# Patient Record
Sex: Female | Born: 1970 | Race: White | Hispanic: No | Smoking: Current every day smoker
Health system: Southern US, Community
[De-identification: ages and names within clinical notes are randomized; demographics above are authoritative.]

## PROBLEM LIST (undated history)

## (undated) DIAGNOSIS — F17200 Nicotine dependence, unspecified, uncomplicated: Secondary | ICD-10-CM

## (undated) DIAGNOSIS — J45909 Unspecified asthma, uncomplicated: Secondary | ICD-10-CM

## (undated) DIAGNOSIS — J9601 Acute respiratory failure with hypoxia: Secondary | ICD-10-CM

## (undated) DIAGNOSIS — J45901 Unspecified asthma with (acute) exacerbation: Secondary | ICD-10-CM

## (undated) HISTORY — PX: TUBAL LIGATION: SHX77

---

## 1998-05-06 ENCOUNTER — Emergency Department (HOSPITAL_COMMUNITY): Admission: EM | Admit: 1998-05-06 | Discharge: 1998-05-06 | Payer: Self-pay | Admitting: Internal Medicine

## 1998-10-29 ENCOUNTER — Emergency Department (HOSPITAL_COMMUNITY): Admission: EM | Admit: 1998-10-29 | Discharge: 1998-10-30 | Payer: Self-pay | Admitting: Emergency Medicine

## 2000-09-02 ENCOUNTER — Emergency Department (HOSPITAL_COMMUNITY): Admission: EM | Admit: 2000-09-02 | Discharge: 2000-09-02 | Payer: Self-pay | Admitting: Emergency Medicine

## 2001-03-11 ENCOUNTER — Emergency Department (HOSPITAL_COMMUNITY): Admission: EM | Admit: 2001-03-11 | Discharge: 2001-03-11 | Payer: Self-pay | Admitting: Internal Medicine

## 2001-07-15 ENCOUNTER — Encounter: Payer: Self-pay | Admitting: Internal Medicine

## 2001-07-15 ENCOUNTER — Ambulatory Visit (HOSPITAL_COMMUNITY): Admission: RE | Admit: 2001-07-15 | Discharge: 2001-07-15 | Payer: Self-pay | Admitting: Internal Medicine

## 2001-09-18 ENCOUNTER — Emergency Department (HOSPITAL_COMMUNITY): Admission: EM | Admit: 2001-09-18 | Discharge: 2001-09-18 | Payer: Self-pay | Admitting: Emergency Medicine

## 2002-06-18 ENCOUNTER — Emergency Department (HOSPITAL_COMMUNITY): Admission: EM | Admit: 2002-06-18 | Discharge: 2002-06-18 | Payer: Self-pay | Admitting: Nurse Practitioner

## 2003-03-28 ENCOUNTER — Encounter: Payer: Self-pay | Admitting: Emergency Medicine

## 2003-03-28 ENCOUNTER — Emergency Department (HOSPITAL_COMMUNITY): Admission: EM | Admit: 2003-03-28 | Discharge: 2003-03-28 | Payer: Self-pay | Admitting: *Deleted

## 2003-11-20 ENCOUNTER — Emergency Department (HOSPITAL_COMMUNITY): Admission: EM | Admit: 2003-11-20 | Discharge: 2003-11-20 | Payer: Self-pay | Admitting: Emergency Medicine

## 2004-03-08 ENCOUNTER — Emergency Department (HOSPITAL_COMMUNITY): Admission: EM | Admit: 2004-03-08 | Discharge: 2004-03-08 | Payer: Self-pay | Admitting: Emergency Medicine

## 2004-04-01 ENCOUNTER — Emergency Department (HOSPITAL_COMMUNITY): Admission: EM | Admit: 2004-04-01 | Discharge: 2004-04-01 | Payer: Self-pay | Admitting: Emergency Medicine

## 2005-02-10 ENCOUNTER — Emergency Department (HOSPITAL_COMMUNITY): Admission: EM | Admit: 2005-02-10 | Discharge: 2005-02-10 | Payer: Self-pay | Admitting: Emergency Medicine

## 2005-04-28 ENCOUNTER — Emergency Department (HOSPITAL_COMMUNITY): Admission: EM | Admit: 2005-04-28 | Discharge: 2005-04-28 | Payer: Self-pay | Admitting: Emergency Medicine

## 2005-07-25 ENCOUNTER — Emergency Department (HOSPITAL_COMMUNITY): Admission: EM | Admit: 2005-07-25 | Discharge: 2005-07-25 | Payer: Self-pay | Admitting: Emergency Medicine

## 2005-10-23 ENCOUNTER — Emergency Department (HOSPITAL_COMMUNITY): Admission: EM | Admit: 2005-10-23 | Discharge: 2005-10-23 | Payer: Self-pay | Admitting: Emergency Medicine

## 2006-06-10 ENCOUNTER — Emergency Department (HOSPITAL_COMMUNITY): Admission: EM | Admit: 2006-06-10 | Discharge: 2006-06-11 | Payer: Self-pay | Admitting: Emergency Medicine

## 2006-06-11 ENCOUNTER — Emergency Department (HOSPITAL_COMMUNITY): Admission: EM | Admit: 2006-06-11 | Discharge: 2006-06-11 | Payer: Self-pay | Admitting: Emergency Medicine

## 2006-07-26 ENCOUNTER — Emergency Department (HOSPITAL_COMMUNITY): Admission: EM | Admit: 2006-07-26 | Discharge: 2006-07-26 | Payer: Self-pay | Admitting: Emergency Medicine

## 2007-01-19 ENCOUNTER — Emergency Department (HOSPITAL_COMMUNITY): Admission: EM | Admit: 2007-01-19 | Discharge: 2007-01-19 | Payer: Self-pay | Admitting: Emergency Medicine

## 2007-06-24 ENCOUNTER — Emergency Department (HOSPITAL_COMMUNITY): Admission: EM | Admit: 2007-06-24 | Discharge: 2007-06-24 | Payer: Self-pay | Admitting: Emergency Medicine

## 2007-10-08 ENCOUNTER — Emergency Department (HOSPITAL_COMMUNITY): Admission: EM | Admit: 2007-10-08 | Discharge: 2007-10-09 | Payer: Self-pay | Admitting: Emergency Medicine

## 2007-10-11 ENCOUNTER — Emergency Department (HOSPITAL_COMMUNITY): Admission: EM | Admit: 2007-10-11 | Discharge: 2007-10-11 | Payer: Self-pay | Admitting: Nurse Practitioner

## 2007-10-17 ENCOUNTER — Emergency Department (HOSPITAL_COMMUNITY): Admission: EM | Admit: 2007-10-17 | Discharge: 2007-10-17 | Payer: Self-pay | Admitting: Emergency Medicine

## 2008-02-04 ENCOUNTER — Emergency Department (HOSPITAL_COMMUNITY): Admission: EM | Admit: 2008-02-04 | Discharge: 2008-02-04 | Payer: Self-pay | Admitting: Emergency Medicine

## 2008-02-11 ENCOUNTER — Emergency Department (HOSPITAL_COMMUNITY): Admission: EM | Admit: 2008-02-11 | Discharge: 2008-02-11 | Payer: Self-pay | Admitting: Emergency Medicine

## 2008-03-27 IMAGING — CR DG CHEST 2V
2 series · 2 of 2 positions shown · non-contrast
Comparison: 02/10/05.

CLINICAL DATA: Asthma, shortness of breath, cough, smoker.
 CHEST - 2 VIEWS:

[w chest pa]
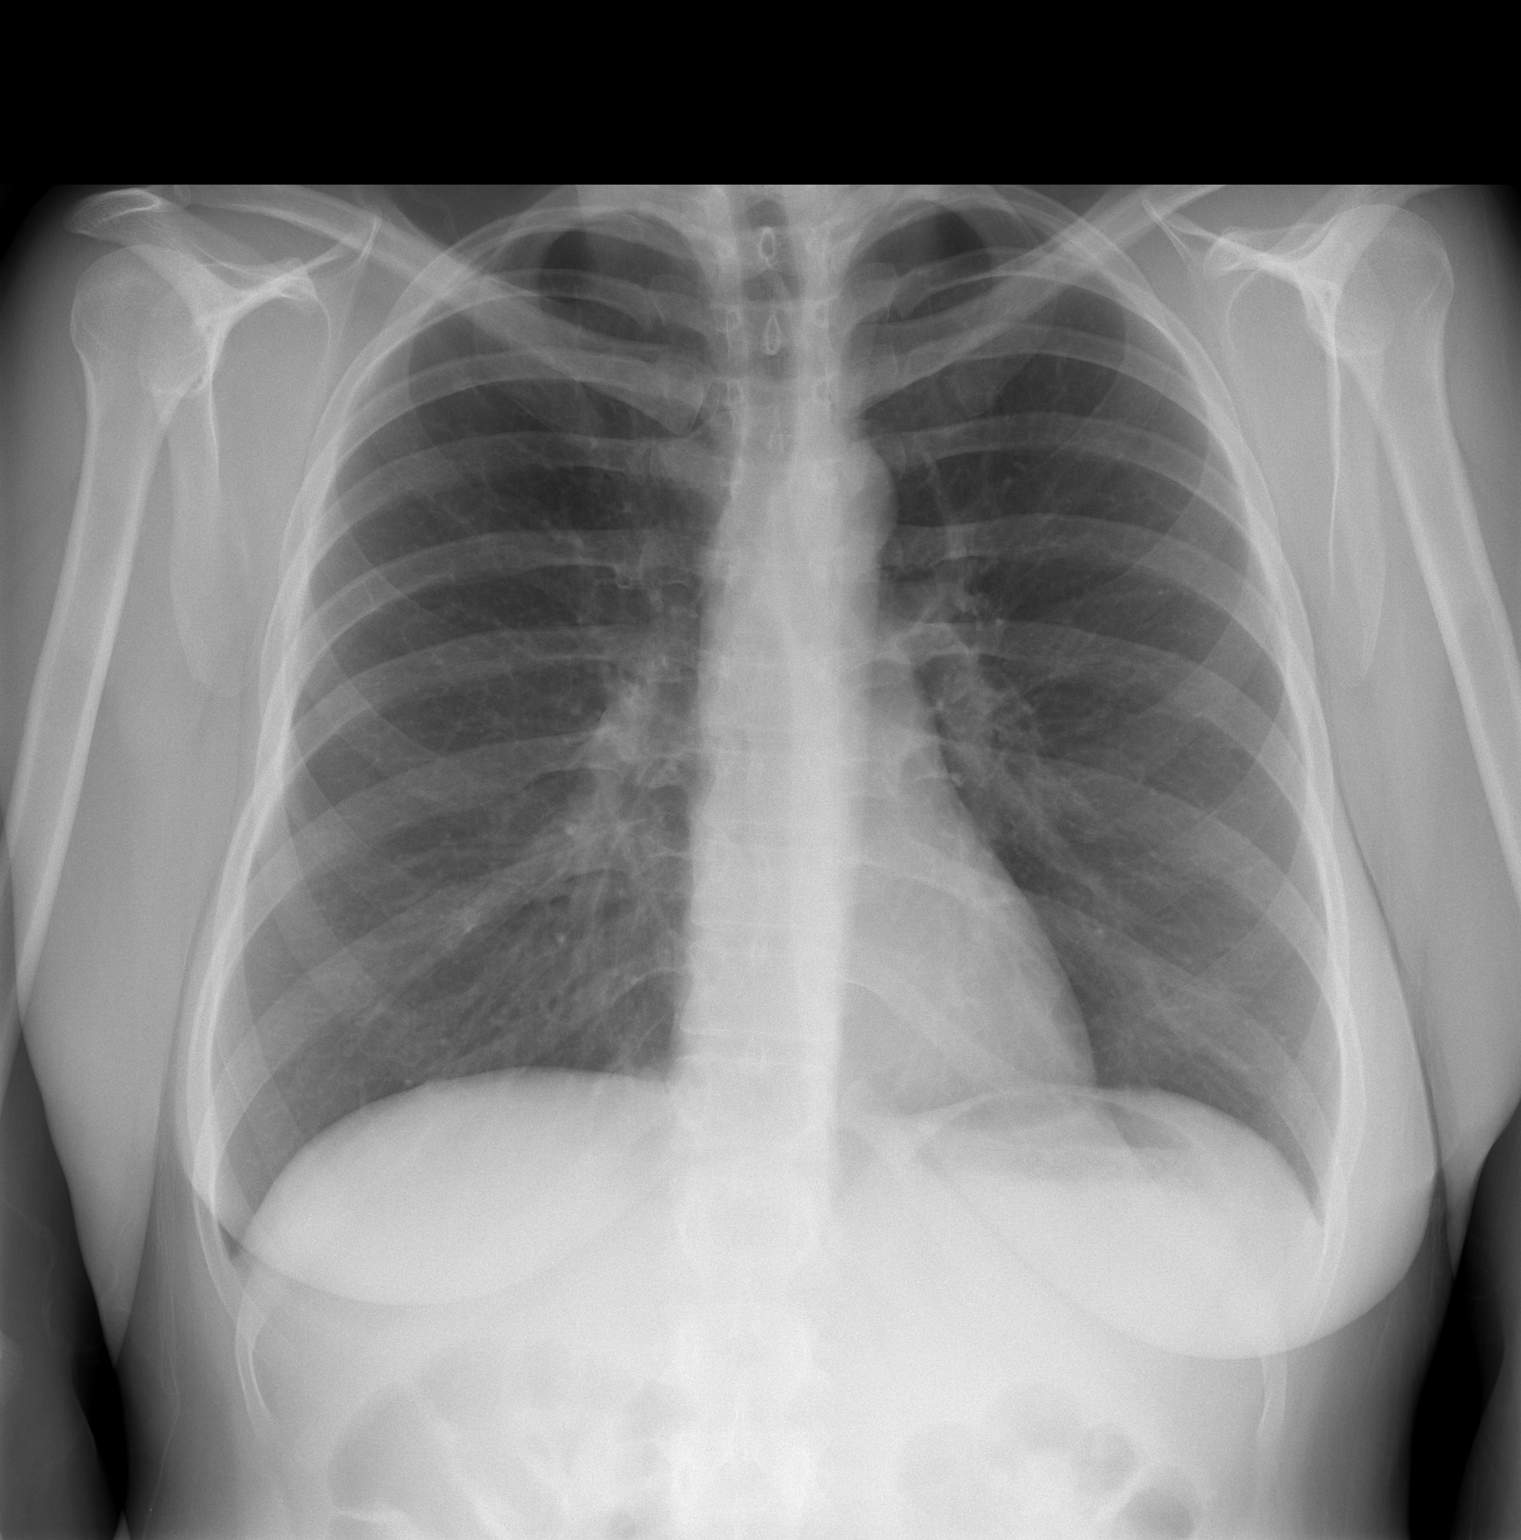

[w chest lat]
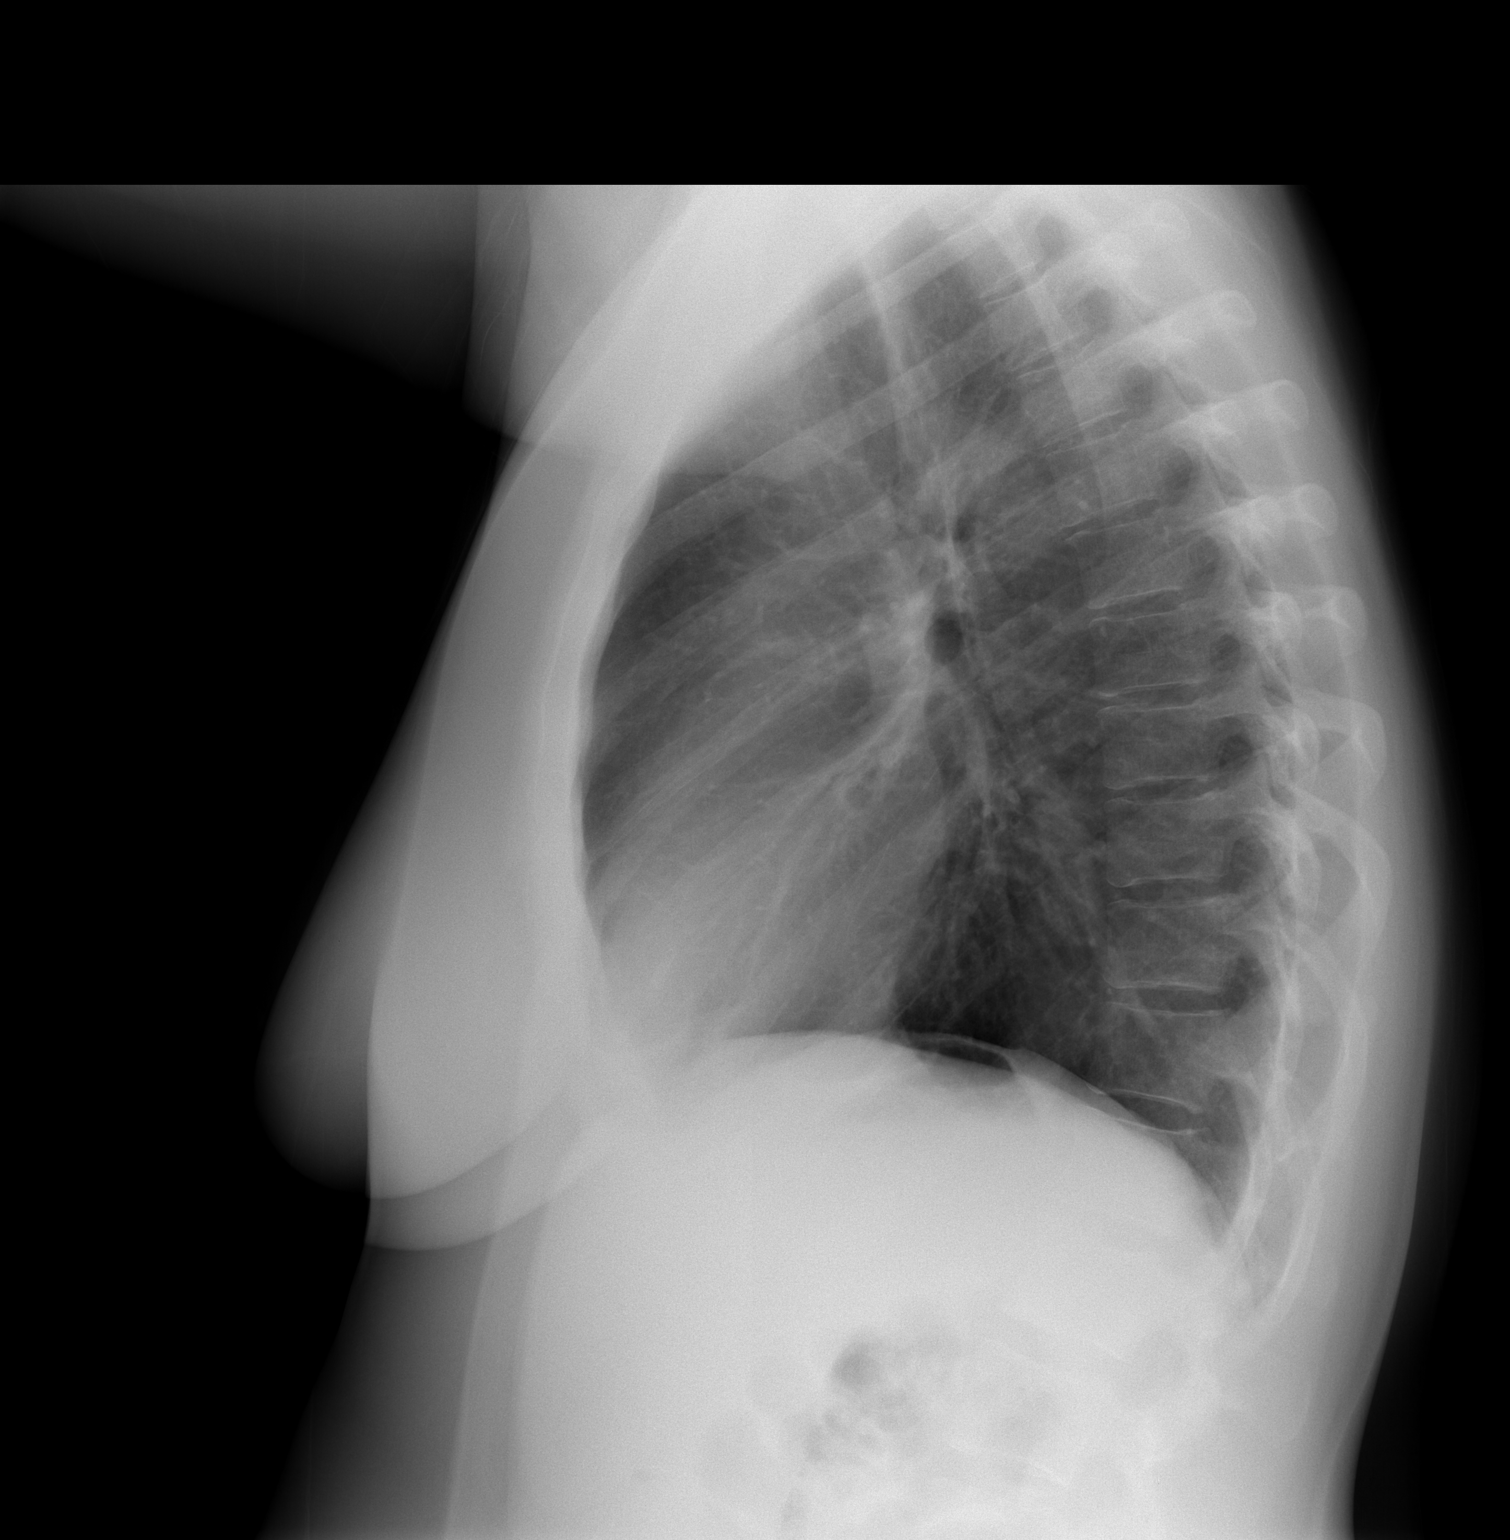

[2 of 2 positions shown; findings below may reference images not displayed]

FINDINGS: There is moderate hyperaeration of the lungs.  The lungs are clear of an active process.  Normal cardiomediastinal silhouette size and contours.
IMPRESSION: Pulmonary hyperaeration is noted and is compatible with asthma.

## 2010-08-07 ENCOUNTER — Emergency Department: Payer: Self-pay | Admitting: Unknown Physician Specialty

## 2010-08-07 ENCOUNTER — Emergency Department: Payer: Self-pay | Admitting: Emergency Medicine

## 2011-07-18 LAB — URINALYSIS, ROUTINE W REFLEX MICROSCOPIC
Glucose, UA: NEGATIVE
Ketones, ur: NEGATIVE
Nitrite: NEGATIVE
Protein, ur: NEGATIVE
Urobilinogen, UA: 0.2

## 2011-07-18 LAB — URINE MICROSCOPIC-ADD ON

## 2011-07-18 LAB — URINE CULTURE

## 2011-07-28 LAB — URINALYSIS, ROUTINE W REFLEX MICROSCOPIC
Nitrite: NEGATIVE
Specific Gravity, Urine: 1.021
Urobilinogen, UA: 1
pH: 6

## 2011-07-28 LAB — INFLUENZA A+B VIRUS AG-DIRECT(RAPID)

## 2011-07-28 LAB — POCT PREGNANCY, URINE: Operator id: 288831

## 2012-06-11 ENCOUNTER — Emergency Department (HOSPITAL_BASED_OUTPATIENT_CLINIC_OR_DEPARTMENT_OTHER)
Admission: EM | Admit: 2012-06-11 | Discharge: 2012-06-12 | Disposition: A | Discharge status: Home / Self Care | Payer: Self-pay | Attending: Emergency Medicine | Admitting: Emergency Medicine

## 2012-06-11 ENCOUNTER — Encounter (HOSPITAL_BASED_OUTPATIENT_CLINIC_OR_DEPARTMENT_OTHER): Payer: Self-pay | Admitting: Emergency Medicine

## 2012-06-11 DIAGNOSIS — J45909 Unspecified asthma, uncomplicated: Secondary | ICD-10-CM | POA: Insufficient documentation

## 2012-06-11 DIAGNOSIS — R404 Transient alteration of awareness: Secondary | ICD-10-CM | POA: Insufficient documentation

## 2012-06-11 DIAGNOSIS — F17200 Nicotine dependence, unspecified, uncomplicated: Secondary | ICD-10-CM | POA: Insufficient documentation

## 2012-06-11 DIAGNOSIS — F1410 Cocaine abuse, uncomplicated: Secondary | ICD-10-CM | POA: Insufficient documentation

## 2012-06-11 HISTORY — DX: Unspecified asthma, uncomplicated: J45.909

## 2012-06-11 MED ORDER — ALBUTEROL SULFATE (5 MG/ML) 0.5% IN NEBU
5.0000 mg | INHALATION_SOLUTION | Freq: Once | RESPIRATORY_TRACT | Status: AC
  Administered 2012-06-11: 5 mg via RESPIRATORY_TRACT

## 2012-06-11 MED ORDER — ALBUTEROL SULFATE (5 MG/ML) 0.5% IN NEBU
INHALATION_SOLUTION | RESPIRATORY_TRACT | Status: AC
  Administered 2012-06-11: 22:00:00
  Filled 2012-06-11: qty 1

## 2012-06-11 MED ORDER — IPRATROPIUM BROMIDE 0.02 % IN SOLN
0.5000 mg | Freq: Once | RESPIRATORY_TRACT | Status: AC
  Administered 2012-06-11: 0.5 mg via RESPIRATORY_TRACT

## 2012-06-11 MED ORDER — ALBUTEROL SULFATE (5 MG/ML) 0.5% IN NEBU
INHALATION_SOLUTION | RESPIRATORY_TRACT | Status: AC
  Administered 2012-06-11: 5 mg via RESPIRATORY_TRACT
  Filled 2012-06-11: qty 1

## 2012-06-11 MED ORDER — IPRATROPIUM BROMIDE 0.02 % IN SOLN
RESPIRATORY_TRACT | Status: AC
  Administered 2012-06-11: 0.5 mg via RESPIRATORY_TRACT
  Filled 2012-06-11: qty 2.5

## 2012-06-11 NOTE — ED Notes (Signed)
Pt is a frequent smoker who has an hx of asthma and uses and Albuterol inhaler but has been out of her RX for a while. Pt transported via EMS with insp ex wheezes bilaterally. Pt was given HHN Albuetrol 5.0 mg per EMS prior to arrival.

## 2012-06-11 NOTE — ED Notes (Signed)
Pt remains lethargic, arouses to touch, alert and oriented upon questioning, reluctant to answer questions

## 2012-06-12 ENCOUNTER — Encounter (HOSPITAL_BASED_OUTPATIENT_CLINIC_OR_DEPARTMENT_OTHER): Payer: Self-pay | Admitting: Emergency Medicine

## 2012-06-12 LAB — RAPID URINE DRUG SCREEN, HOSP PERFORMED
Amphetamines: NOT DETECTED
Barbiturates: NOT DETECTED
Opiates: NOT DETECTED
Tetrahydrocannabinol: NOT DETECTED

## 2012-06-12 MED ORDER — ALBUTEROL SULFATE (5 MG/ML) 0.5% IN NEBU
5.0000 mg | INHALATION_SOLUTION | Freq: Once | RESPIRATORY_TRACT | Status: AC
  Administered 2012-06-12: 5 mg via RESPIRATORY_TRACT
  Filled 2012-06-12: qty 1

## 2012-06-12 MED ORDER — ALBUTEROL SULFATE HFA 108 (90 BASE) MCG/ACT IN AERS
2.0000 | INHALATION_SPRAY | RESPIRATORY_TRACT | Status: DC | PRN
  Administered 2012-06-12: 2 via RESPIRATORY_TRACT
  Filled 2012-06-12: qty 6.7

## 2012-06-12 MED ORDER — FLUTICASONE PROPIONATE HFA 44 MCG/ACT IN AERO
2.0000 | INHALATION_SPRAY | Freq: Two times a day (BID) | RESPIRATORY_TRACT | Status: DC
  Administered 2012-06-12: 2 via RESPIRATORY_TRACT

## 2012-06-12 MED ORDER — DEXAMETHASONE SODIUM PHOSPHATE 10 MG/ML IJ SOLN
10.0000 mg | Freq: Once | INTRAMUSCULAR | Status: AC
  Administered 2012-06-12: 10 mg via INTRAVENOUS
  Filled 2012-06-12: qty 1

## 2012-06-12 MED ORDER — DEXAMETHASONE SODIUM PHOSPHATE 10 MG/ML IJ SOLN: 10.0000 mg | Freq: Once | INTRAMUSCULAR | Status: DC

## 2012-06-12 MED ORDER — FLUTICASONE PROPIONATE HFA 44 MCG/ACT IN AERO
INHALATION_SPRAY | RESPIRATORY_TRACT | Status: AC
  Administered 2012-06-12: 2 via RESPIRATORY_TRACT
  Filled 2012-06-12: qty 10.6

## 2012-06-12 NOTE — ED Provider Notes (Signed)
History     CSN: [id number]  Arrival date & time 06/11/12  2222   First MD Initiated Contact with Patient 06/12/12 0053      Chief Complaint  Patient presents with  . Wheezing    (Consider location/radiation/quality/duration/timing/severity/associated sxs/prior treatment) HPI Level 5 Caveat: somnolence. This is a 41 year old white female with a history of asthma. She states she has had an exacerbation of her asthma intermittently for the past 2 weeks. She does not have an albuterol inhaler anymore but has used a friend's inhaler. She was brought by EMS who administered 5 mg of albuterol prior to arrival. She was given another albuterol treatment by respiratory therapy here. She states that these improved her breathing.  The patient keeps falling asleep in mid sentence during attempted history taking. She denies any drug use stating that she has narcolepsy and she is exhausted from work of breathing.  History reviewed. No pertinent past medical history.  History reviewed. No pertinent past surgical history.  History reviewed. No pertinent family history.  History  Substance Use Topics  . Smoking status: Current Everyday Smoker  . Smokeless tobacco: Not on file  . Alcohol Use: Yes    OB History    Grav Para Term Preterm Abortions TAB SAB Ect Mult Living                  Review of Systems  Unable to perform ROS   Allergies  Penicillins  Home Medications  No current outpatient prescriptions on file.  BP 95/72  Pulse 114  Temp 98.3 F (36.8 C) (Oral)  Resp 18  Ht 5' 4.5" (1.638 m)  Wt 160 lb (72.576 kg)  BMI 27.04 kg/m2  SpO2 98%  LMP 06/04/2012  Physical Exam General: Well-developed, well-nourished female in no acute distress; appearance consistent with age of record HENT: normocephalic, atraumatic Eyes: pupils equal round and reactive to light; extraocular muscles intact Neck: supple Heart: regular rate and rhythm; tachycardic Lungs: Wheezing on  expiration Abdomen: soft; nondistended Extremities: No deformity; full range of motion; pulses normal; no edema Neurologic: Somnolent but arousable; motor function intact in all extremities and symmetric; no facial droop Skin: Warm and dry     ED Course  Procedures (including critical care time)     MDM   Nursing notes and vitals signs, including pulse oximetry, reviewed.  Summary of this visit's results, reviewed by myself:  Labs:  Results for orders placed during the hospital encounter of 06/11/12  URINE RAPID DRUG SCREEN (HOSP PERFORMED)      Component Value Range   Opiates NONE DETECTED  NONE DETECTED   Cocaine POSITIVE (*) NONE DETECTED   Benzodiazepines NONE DETECTED  NONE DETECTED   Amphetamines NONE DETECTED  NONE DETECTED   Tetrahydrocannabinol NONE DETECTED  NONE DETECTED   Barbiturates NONE DETECTED  NONE DETECTED   2:17 AM Patient still arousable but very somnolent. Drug screen positive for cocaine. Suspect patient recently binged. This was in the form of crack, smoking it may have exacerbated her asthma.  5:53 AM Patient now awake and conversant. Wheezing remains but air movement is significantly improved. We will provide the patient an inhaler.         Hanley Seamen, MD 06/12/12 [phone]

## 2012-06-12 NOTE — ED Notes (Signed)
Pt is asleep and in no respiratory distress .

## 2013-09-10 ENCOUNTER — Encounter (HOSPITAL_COMMUNITY): Payer: Self-pay | Admitting: Emergency Medicine

## 2013-09-10 ENCOUNTER — Emergency Department (HOSPITAL_COMMUNITY)
Admission: EM | Admit: 2013-09-10 | Discharge: 2013-09-10 | Disposition: A | Discharge status: Home / Self Care | Payer: Self-pay | Attending: Emergency Medicine | Admitting: Emergency Medicine

## 2013-09-10 DIAGNOSIS — J029 Acute pharyngitis, unspecified: Secondary | ICD-10-CM | POA: Insufficient documentation

## 2013-09-10 DIAGNOSIS — J45909 Unspecified asthma, uncomplicated: Secondary | ICD-10-CM

## 2013-09-10 DIAGNOSIS — F17200 Nicotine dependence, unspecified, uncomplicated: Secondary | ICD-10-CM | POA: Insufficient documentation

## 2013-09-10 DIAGNOSIS — Z880 Allergy status to penicillin: Secondary | ICD-10-CM | POA: Insufficient documentation

## 2013-09-10 DIAGNOSIS — J45901 Unspecified asthma with (acute) exacerbation: Secondary | ICD-10-CM | POA: Insufficient documentation

## 2013-09-10 MED ORDER — ALBUTEROL SULFATE HFA 108 (90 BASE) MCG/ACT IN AERS
2.0000 | INHALATION_SPRAY | Freq: Once | RESPIRATORY_TRACT | Status: AC
  Administered 2013-09-10: 2 via RESPIRATORY_TRACT
  Filled 2013-09-10: qty 6.7

## 2013-09-10 MED ORDER — ALBUTEROL SULFATE (5 MG/ML) 0.5% IN NEBU
5.0000 mg | INHALATION_SOLUTION | Freq: Once | RESPIRATORY_TRACT | Status: AC
  Administered 2013-09-10: 5 mg via RESPIRATORY_TRACT

## 2013-09-10 MED ORDER — PREDNISONE 10 MG PO TABS: ORAL_TABLET | ORAL | Status: DC

## 2013-09-10 MED ORDER — ALBUTEROL SULFATE (5 MG/ML) 0.5% IN NEBU
5.0000 mg | INHALATION_SOLUTION | Freq: Once | RESPIRATORY_TRACT | Status: AC
  Administered 2013-09-10: 5 mg via RESPIRATORY_TRACT
  Filled 2013-09-10: qty 1

## 2013-09-10 MED ORDER — PREDNISONE 20 MG PO TABS
60.0000 mg | ORAL_TABLET | Freq: Every day | ORAL | Status: DC
  Administered 2013-09-10: 60 mg via ORAL
  Filled 2013-09-10: qty 3

## 2013-09-10 NOTE — ED Notes (Signed)
Pt states that she has asthma and out of inhalers and been having shob for about 4 days and been trying at home remedies.

## 2013-09-10 NOTE — ED Notes (Signed)
RT called for additional breathing tx

## 2013-09-10 NOTE — ED Provider Notes (Signed)
CSN: [id number]     Arrival date & time 09/10/13  1431 History  This chart was scribed for non-physician practitioner Ok Edwards, PA-C working with Shon Baton, MD by Leone Payor, ED Scribe. This patient was seen in room WTR9/WTR9 and the patient's care was started at 1431.    Chief Complaint  Patient presents with  . Asthma    Patient is a 42 y.o. female presenting with asthma. The history is provided by the patient. No language interpreter was used.  Asthma This is a chronic problem. The current episode started more than 2 days ago. The problem occurs constantly. The problem has not changed since onset.Associated symptoms include shortness of breath. The symptoms are aggravated by smoking and coughing. Nothing relieves the symptoms. She has tried nothing for the symptoms. The treatment provided no relief.  Asthma This is a chronic problem. The current episode started more than 2 days ago. The problem occurs constantly. The problem has not changed since onset.Associated symptoms include coughing and a sore throat. The symptoms are aggravated by smoking and coughing. She has tried nothing for the symptoms. The treatment provided no relief.     HPI Comments: Samantha Cherry is a 42 y.o. female who presents to the Emergency Department complaining of an asthma flare up for the past several day. Pt states she has run out of her inhaler and has been SOB as a result. She also has an associated cough and mild sore throat. She has tried home remedies without relief. Pt is a daily smoker.   Past Medical History  Diagnosis Date  . Asthma    History reviewed. No pertinent past surgical history. No family history on file. History  Substance Use Topics  . Smoking status: Current Every Day Smoker  . Smokeless tobacco: Not on file  . Alcohol Use: Yes   OB History   Grav Para Term Preterm Abortions TAB SAB Ect Mult Living                 Review of Systems  HENT: Positive for  sore throat.   Respiratory: Positive for cough and shortness of breath.   All other systems reviewed and are negative.    Allergies  Sulfa antibiotics and Penicillins  Home Medications   Current Outpatient Rx  Name  Route  Sig  Dispense  Refill  . acetaminophen (TYLENOL) 325 MG tablet   Oral   Take 650 mg by mouth every 6 (six) hours as needed (pain).          BP 136/90  Pulse 82  Temp(Src) 98.5 F (36.9 C) (Oral)  Resp 22  SpO2 94% Physical Exam  Nursing note and vitals reviewed. Constitutional: She is oriented to person, place, and time. She appears well-developed and well-nourished.  HENT:  Head: Normocephalic.  Eyes: EOM are normal.  Neck: Normal range of motion.  Pulmonary/Chest: Effort normal. She has wheezes. She has rhonchi.  Abdominal: She exhibits no distension.  Musculoskeletal: Normal range of motion.  Neurological: She is alert and oriented to person, place, and time.  Psychiatric: She has a normal mood and affect.    ED Course  Procedures   DIAGNOSTIC STUDIES: Oxygen Saturation is 94% on RA, adequate by my interpretation.    COORDINATION OF CARE: 3:40 PM Will order a breathing treatment. Discussed treatment plan with pt at bedside and pt agreed to plan.    Labs Review Labs Reviewed - No data to display Imaging Review No results  found.  EKG Interpretation   None       MDM   1. Asthma       I personally performed the services in this documentation, which was scribed in my presence.  The recorded information has been reviewed and considered.   Barnet Pall.  Lonia Skinner Fingal, PA-C 09/10/13 [phone]

## 2013-09-10 NOTE — Progress Notes (Signed)
[facility] provided pt with a list of primary care resources and a [facility] application.  °

## 2013-09-11 NOTE — ED Provider Notes (Signed)
Medical screening examination/treatment/procedure(s) were performed by non-physician practitioner and as supervising physician I was immediately available for consultation/collaboration.  EKG Interpretation   None        Courtney F Horton, MD 09/11/13 1117 

## 2013-11-18 ENCOUNTER — Emergency Department (HOSPITAL_COMMUNITY)
Admission: EM | Admit: 2013-11-18 | Discharge: 2013-11-18 | Disposition: A | Discharge status: Home / Self Care | Payer: Self-pay | Attending: Emergency Medicine | Admitting: Emergency Medicine

## 2013-11-18 ENCOUNTER — Encounter (HOSPITAL_COMMUNITY): Payer: Self-pay | Admitting: Emergency Medicine

## 2013-11-18 ENCOUNTER — Emergency Department (HOSPITAL_COMMUNITY): Payer: Self-pay

## 2013-11-18 DIAGNOSIS — F17200 Nicotine dependence, unspecified, uncomplicated: Secondary | ICD-10-CM | POA: Insufficient documentation

## 2013-11-18 DIAGNOSIS — R0789 Other chest pain: Secondary | ICD-10-CM | POA: Insufficient documentation

## 2013-11-18 DIAGNOSIS — Z880 Allergy status to penicillin: Secondary | ICD-10-CM | POA: Insufficient documentation

## 2013-11-18 DIAGNOSIS — R000 Tachycardia, unspecified: Secondary | ICD-10-CM | POA: Insufficient documentation

## 2013-11-18 DIAGNOSIS — R51 Headache: Secondary | ICD-10-CM | POA: Insufficient documentation

## 2013-11-18 DIAGNOSIS — J45901 Unspecified asthma with (acute) exacerbation: Secondary | ICD-10-CM | POA: Insufficient documentation

## 2013-11-18 DIAGNOSIS — J40 Bronchitis, not specified as acute or chronic: Secondary | ICD-10-CM

## 2013-11-18 MED ORDER — PREDNISONE 10 MG PO TABS: 20.0000 mg | ORAL_TABLET | Freq: Every day | ORAL | Status: DC

## 2013-11-18 MED ORDER — ALBUTEROL (5 MG/ML) CONTINUOUS INHALATION SOLN
10.0000 mg/h | INHALATION_SOLUTION | Freq: Once | RESPIRATORY_TRACT | Status: AC
  Administered 2013-11-18: 10 mg/h via RESPIRATORY_TRACT

## 2013-11-18 MED ORDER — ALBUTEROL SULFATE (2.5 MG/3ML) 0.083% IN NEBU
INHALATION_SOLUTION | RESPIRATORY_TRACT | Status: AC
  Filled 2013-11-18: qty 12

## 2013-11-18 MED ORDER — ALBUTEROL SULFATE HFA 108 (90 BASE) MCG/ACT IN AERS
2.0000 | INHALATION_SPRAY | Freq: Once | RESPIRATORY_TRACT | Status: AC
  Administered 2013-11-18: 2 via RESPIRATORY_TRACT
  Filled 2013-11-18: qty 6.7

## 2013-11-18 MED ORDER — METHYLPREDNISOLONE SODIUM SUCC 125 MG IJ SOLR
125.0000 mg | Freq: Once | INTRAMUSCULAR | Status: AC
  Administered 2013-11-18: 125 mg via INTRAVENOUS
  Filled 2013-11-18: qty 2

## 2013-11-18 MED ORDER — HYDROCOD POLST-CHLORPHEN POLST 10-8 MG/5ML PO LQCR
5.0000 mL | Freq: Once | ORAL | Status: AC
  Administered 2013-11-18: 5 mL via ORAL
  Filled 2013-11-18: qty 5

## 2013-11-18 MED ORDER — AZITHROMYCIN 250 MG PO TABS: 250.0000 mg | ORAL_TABLET | Freq: Every day | ORAL | Status: DC

## 2013-11-18 MED ORDER — IPRATROPIUM BROMIDE 0.02 % IN SOLN: 0.5000 mg | Freq: Once | RESPIRATORY_TRACT | Status: DC

## 2013-11-18 MED ORDER — IPRATROPIUM BROMIDE 0.02 % IN SOLN
RESPIRATORY_TRACT | Status: AC
  Administered 2013-11-18: 15:00:00
  Filled 2013-11-18: qty 2.5

## 2013-11-18 MED ORDER — HYDROCOD POLST-CHLORPHEN POLST 10-8 MG/5ML PO LQCR: 5.0000 mL | Freq: Two times a day (BID) | ORAL | Status: DC | PRN

## 2013-11-18 NOTE — ED Provider Notes (Signed)
CSN: [id number]     Arrival date & time 11/18/13  1452 History  This chart was scribed for non-physician practitioner, Izola PriceFrances C. Marisue HumbleSanford, PA-C working with No att. providers found by Greggory StallionKayla Andersen, ED scribe. This patient was seen in room WTR6/WTR6 and the patient's care was started at 2:57 PM.   Chief Complaint  Patient presents with  . Asthma   HPI Comments: Patient here with complaints of shortness of breath over the past 3-4 days, reports no fever, chills, reports coughing up green/yellow thick sputum.  States headache, wheezing and is out of her albuterol inhaler.  She states mild chest tightness without pain.  She reports shortness of breath.  The history is provided by the patient. No language interpreter was used.   HPI Comments:   Past Medical History  Diagnosis Date  . Asthma    No past surgical history on file. No family history on file. History  Substance Use Topics  . Smoking status: Current Every Day Smoker  . Smokeless tobacco: Not on file  . Alcohol Use: Yes   OB History   Grav Para Term Preterm Abortions TAB SAB Ect Mult Living                 Review of Systems  Respiratory: Positive for cough and shortness of breath.   All other systems reviewed and are negative.    Allergies  Sulfa antibiotics and Penicillins  Home Medications   Current Outpatient Rx  Name  Route  Sig  Dispense  Refill  . acetaminophen (TYLENOL) 325 MG tablet   Oral   Take 650 mg by mouth every 6 (six) hours as needed (pain).         . predniSONE (DELTASONE) 10 MG tablet      6,5,4,3,2,1 taper   21 tablet   0    LMP 10/28/2013  Physical Exam  Nursing note and vitals reviewed. Constitutional: She is oriented to person, place, and time. She appears well-developed and well-nourished. No distress.  HENT:  Head: Normocephalic and atraumatic.  Right Ear: External ear normal.  Left Ear: External ear normal.  Nose: Nose normal.  Mouth/Throat: Oropharynx is clear and  moist. No oropharyngeal exudate.  Eyes: Conjunctivae are normal. Pupils are equal, round, and reactive to light. No scleral icterus.  Neck: Normal range of motion. Neck supple.  Cardiovascular: Regular rhythm and normal heart sounds.  Exam reveals no gallop and no friction rub.   No murmur heard. tachycardia  Pulmonary/Chest: Accessory muscle usage present. Tachypnea noted. She is in respiratory distress. She has decreased breath sounds in the right middle field, the right lower field, the left middle field and the left lower field. She has wheezes in the right upper field. She has no rhonchi. She has no rales.  Abdominal: Soft. Bowel sounds are normal. She exhibits no distension. There is no tenderness. There is no rebound and no guarding.  Musculoskeletal: Normal range of motion. She exhibits no edema and no tenderness.  Lymphadenopathy:    She has no cervical adenopathy.  Neurological: She is alert and oriented to person, place, and time. She exhibits normal muscle tone. Coordination normal.  Skin: Skin is warm and dry. No rash noted. No erythema. No pallor.  Psychiatric: She has a normal mood and affect. Her behavior is normal. Judgment and thought content normal.    ED Course  Procedures (including critical care time)  DIAGNOSTIC STUDIES: Oxygen Saturation is 95% on room air, adequate by my interpretation.  COORDINATION OF CARE: 2:56 PM-Discussed treatment plan which includes neb treatments, steroids, chest x-ray with pt at bedside and pt agreed to plan.   Labs Review Labs Reviewed - No data to display Imaging Review No results found.  EKG Interpretation   None      Results for orders placed during the hospital encounter of 06/11/12  URINE RAPID DRUG SCREEN (HOSP PERFORMED)      Result Value Range   Opiates NONE DETECTED  NONE DETECTED   Cocaine POSITIVE (*) NONE DETECTED   Benzodiazepines NONE DETECTED  NONE DETECTED   Amphetamines NONE DETECTED  NONE DETECTED    Tetrahydrocannabinol NONE DETECTED  NONE DETECTED   Barbiturates NONE DETECTED  NONE DETECTED   Dg Chest 2 View  11/18/2013   CLINICAL DATA:  Cough and dyspnea, history of asthma and smoking.  EXAM: CHEST  2 VIEW  COMPARISON:  DG CHEST 2 VIEW dated 10/17/2007  FINDINGS: The lungs are well-expanded. There is no focal infiltrate. The interstitial markings are minimally prominent likely reflecting the patient's smoking history. There is no pleural effusion or pneumothorax. The cardiac silhouette is normal in size. The pulmonary vascularity is not engorged. The mediastinum is normal in width. The observed portions of the bony thorax appear normal.  IMPRESSION: There is no evidence of pneumonia nor other acute cardiopulmonary disease. One cannot exclude acute bronchitis in the appropriate clinical setting.   Electronically Signed   By: David  [place]   On: 11/18/2013 15:53    Medications  albuterol (PROVENTIL) (2.5 MG/3ML) 0.083% nebulizer solution (  Not Given 11/18/13 1515)  ipratropium (ATROVENT) nebulizer solution 0.5 mg (0.5 mg Nebulization Not Given 11/18/13 1515)  ipratropium (ATROVENT) 0.02 % nebulizer solution (  Given 11/18/13 1506)  albuterol (PROVENTIL,VENTOLIN) solution continuous neb (10 mg/hr Nebulization Given 11/18/13 1505)  methylPREDNISolone sodium succinate (SOLU-MEDROL) 125 mg/2 mL injection 125 mg (125 mg Intravenous Given 11/18/13 1640)  chlorpheniramine-HYDROcodone (TUSSIONEX) 10-8 MG/5ML suspension 5 mL (5 mLs Oral Given 11/18/13 1640)    MDM  Asthma exacerbation Bronchitis  Patient with long history of asthma presents to the ED with complaints of shortness of breath, cough after running out of her inhaler.  After hour long breathing treatment, patient's heart rate is now down to 98 and her respiratory rate back to 16.  She is no longer using accessory muscles, reports improvement in symptoms.  I have given her an inhaler here and will start her on antibiotics and steroids.  I  personally performed the services described in this documentation, which was scribed in my presence. The recorded information has been reviewed and is accurate.     Izola Price Marisue Humble, PA-C 11/18/13 1740

## 2013-11-18 NOTE — Discharge Instructions (Signed)
Asthma, Adult °Asthma is a recurring condition in which the airways tighten and narrow. Asthma can make it difficult to breathe. It can cause coughing, wheezing, and shortness of breath. Asthma episodes (also called asthma attacks) range from minor to life-threatening. Asthma cannot be cured, but medicines and lifestyle changes can help control it. °CAUSES °Asthma is believed to be caused by inherited (genetic) and environmental factors, but its exact cause is unknown. Asthma may be triggered by allergens, lung infections, or irritants in the air. Asthma triggers are different for each person. Common triggers include:  °· Animal dander. °· Dust mites. °· Cockroaches. °· Pollen from trees or grass. °· Mold. °· Smoke. °· Air pollutants such as dust, household cleaners, hair sprays, aerosol sprays, paint fumes, strong chemicals, or strong odors. °· Cold air, weather changes, and winds (which increase molds and pollens in the air). °· Strong emotional expressions such as crying or laughing hard. °· Stress. °· Certain medicines (such as aspirin) or types of drugs (such as beta-blockers). °· Sulfites in foods and drinks. Foods and drinks that may contain sulfites include dried fruit, potato chips, and sparkling grape juice. °· Infections or inflammatory conditions such as the flu, a cold, or an inflammation of the nasal membranes (rhinitis). °· Gastroesophageal reflux disease (GERD). °· Exercise or strenuous activity. °SYMPTOMS °Symptoms may occur immediately after asthma is triggered or many hours later. Symptoms include: °· Wheezing. °· Excessive nighttime or early morning coughing. °· Frequent or severe coughing with a common cold. °· Chest tightness. °· Shortness of breath. °DIAGNOSIS  °The diagnosis of asthma is made by a review of your medical history and a physical exam. Tests may also be performed. These may include: °· Lung function studies. These tests show how much air you breath in and out. °· Allergy  tests. °· Imaging tests such as X-rays. °TREATMENT  °Asthma cannot be cured, but it can usually be controlled. Treatment involves identifying and avoiding your asthma triggers. It also involves medicines. There are 2 classes of medicine used for asthma treatment:  °· Controller medicines. These prevent asthma symptoms from occurring. They are usually taken every day. °· Reliever or rescue medicines. These quickly relieve asthma symptoms. They are used as needed and provide short-term relief. °Your health care provider will help you create an asthma action plan. An asthma action plan is a written plan for managing and treating your asthma attacks. It includes a list of your asthma triggers and how they may be avoided. It also includes information on when medicines should be taken and when their dosage should be changed. An action plan may also involve the use of a device called a peak flow meter. A peak flow meter measures how well the lungs are working. It helps you monitor your condition. °HOME CARE INSTRUCTIONS  °· Take medicine as directed by your health care provider. Speak with your health care provider if you have questions about how or when to take the medicines. °· Use a peak flow meter as directed by your health care provider. Record and keep track of readings. °· Understand and use the action plan to help minimize or stop an asthma attack without needing to seek medical care. °· Control your home environment in the following ways to help prevent asthma attacks: °· Do not smoke. Avoid being exposed to secondhand smoke. °· Change your heating and air conditioning filter regularly. °· Limit your use of fireplaces and wood stoves. °· Get rid of pests (such as roaches and   mice) and their droppings. °· Throw away plants if you see mold on them. °· Clean your floors and dust regularly. Use unscented cleaning products. °· Try to have someone else vacuum for you regularly. Stay out of rooms while they are being  vacuumed and for a short while afterward. If you vacuum, use a dust mask from a hardware store, a double-layered or microfilter vacuum cleaner bag, or a vacuum cleaner with a HEPA filter. °· Replace carpet with wood, tile, or vinyl flooring. Carpet can trap dander and dust. °· Use allergy-proof pillows, mattress covers, and box spring covers. °· Wash bed sheets and blankets every week in hot water and dry them in a dryer. °· Use blankets that are made of polyester or cotton. °· Clean bathrooms and kitchens with bleach. If possible, have someone repaint the walls in these rooms with mold-resistant paint. Keep out of the rooms that are being cleaned and painted. °· Wash hands frequently. °SEEK MEDICAL CARE IF:  °· You have wheezing, shortness of breath, or a cough even if taking medicine to prevent attacks. °· The colored mucus you cough up (sputum) is thicker than usual. °· Your sputum changes from clear or white to yellow, green, gray, or bloody. °· You have any problems that may be related to the medicines you are taking (such as a rash, itching, swelling, or trouble breathing). °· You are using a reliever medicine more than 2 3 times per week. °· Your peak flow is still at 50 79% of you personal best after following your action plan for 1 hour. °SEEK IMMEDIATE MEDICAL CARE IF:  °· You seem to be getting worse and are unresponsive to treatment during an asthma attack. °· You are short of breath even at rest. °· You get short of breath when doing very little physical activity. °· You have difficulty eating, drinking, or talking due to asthma symptoms. °· You develop chest pain. °· You develop a fast heartbeat. °· You have a bluish color to your lips or fingernails. °· You are lightheaded, dizzy, or faint. °· Your peak flow is less than 50% of your personal best. °· You have a fever or persistent symptoms for more than 2 3 days. °· You have a fever and symptoms suddenly get worse. °MAKE SURE YOU:  °· Understand these  instructions. °· Will watch your condition. °· Will get help right away if you are not doing well or get worse. °Document Released: 10/09/2005 Document Revised: 06/11/2013 Document Reviewed: 05/08/2013 °[employer]® Patient Information ©2014 [employer]. ° °Bronchitis °Bronchitis is inflammation of the airways that extend from the windpipe into the lungs (bronchi). The inflammation often causes mucus to develop, which leads to a cough. If the inflammation becomes severe, it may cause shortness of breath. °CAUSES  °Bronchitis may be caused by:  °· Viral infections.   °· Bacteria.   °· Cigarette smoke.   °· Allergens, pollutants, and other irritants.   °SIGNS AND SYMPTOMS  °The most common symptom of bronchitis is a frequent cough that produces mucus. Other symptoms include: °· Fever.   °· Body aches.   °· Chest congestion.   °· Chills.   °· Shortness of breath.   °· Sore throat.   °DIAGNOSIS  °Bronchitis is usually diagnosed through a medical history and physical exam. Tests, such as chest X-rays, are sometimes done to rule out other conditions.  °TREATMENT  °You may need to avoid contact with whatever caused the problem (smoking, for example). Medicines are sometimes needed. These may include: °· Antibiotics. These may be prescribed   if the condition is caused by bacteria.  Cough suppressants. These may be prescribed for relief of cough symptoms.   Inhaled medicines. These may be prescribed to help open your airways and make it easier for you to breathe.   Steroid medicines. These may be prescribed for those with recurrent (chronic) bronchitis. HOME CARE INSTRUCTIONS  Get plenty of rest.   Drink enough fluids to keep your urine clear or pale yellow (unless you have a medical condition that requires fluid restriction). Increasing fluids may help thin your secretions and will prevent dehydration.   Only take over-the-counter or prescription medicines as directed by your health care provider.  Only  take antibiotics as directed. Make sure you finish them even if you start to feel better.  Avoid secondhand smoke, irritating chemicals, and strong fumes. These will make bronchitis worse. If you are a smoker, quit smoking. Consider using nicotine gum or skin patches to help control withdrawal symptoms. Quitting smoking will help your lungs heal faster.   Put a cool-mist humidifier in your bedroom at night to moisten the air. This may help loosen mucus. Change the water in the humidifier daily. You can also run the hot water in your shower and sit in the bathroom with the door closed for 5 10 minutes.   Follow up with your health care provider as directed.   Wash your hands frequently to avoid catching bronchitis again or spreading an infection to others.  SEEK MEDICAL CARE IF: Your symptoms do not improve after 1 week of treatment.  SEEK IMMEDIATE MEDICAL CARE IF:  Your fever increases.  You have chills.   You have chest pain.   You have worsening shortness of breath.   You have bloody sputum.  You faint.  You have lightheadedness.  You have a severe headache.   You vomit repeatedly. MAKE SURE YOU:   Understand these instructions.  Will watch your condition.  Will get help right away if you are not doing well or get worse. Document Released: 10/09/2005 Document Revised: 07/30/2013 Document Reviewed: 06/03/2013 [facility] Patient Information 2014 [place], [place].  Chronic Asthmatic Bronchitis Chronic asthmatic bronchitis is a complication of persistent asthma. After a period of time with asthma, some people develop airflow obstruction that is present all the time, even when not having an asthma attack.There is also persistent inflammation of the airways, and the bronchial tubes produce more mucus. Chronic asthmatic bronchitis usually is a permanent problem with the lungs. CAUSES  Chronic asthmatic bronchitis happens most often in people who have asthma and also  smoke cigarettes. Occasionally, it can happen to a person with long-standing or severe asthma even if the person is not a smoker. SIGNS AND SYMPTOMS  Chronic asthmatic bronchitis usually causes symptoms of both asthma and chronic bronchitis, including:   Coughing.  Increased sputum production.  Wheezing and shortness of breath.  Chest discomfort.  Recurring infections. DIAGNOSIS  Your health care provider will take a medical history and perform a physical exam. Chronic asthmatic bronchitis is suspected when a person with asthma has abnormal results on breathing tests (pulmonary function tests) even when breathing symptoms are at their best. Other tests, such as a chest X-ray, may be performed to rule out other conditions.  TREATMENT  Treatment involves controlling symptoms with medicine and lifestyle changes.  Your health care provider may prescribe asthma medicines, including inhaler and nebulizer medicines.  Infection can be treated with medicine to kill germs (antibiotics). Serious infections may require hospitalization. These can include:  Pneumonia.  Sinus infections.  Acute bronchitis.   Preventing infection and hospitalization is very important. Get an influenza vaccination every year as directed by your health care provider. Ask your health care provider whether you need a pneumonia vaccine.  Ask your health care provider whether you would benefit from a pulmonary rehabilitation program. HOME CARE INSTRUCTIONS  Only take over-the-counter or prescription medicine as directed by your health care provider.  If you are a cigarette smoker, the most important thing that you can do is quit. Talk to your health care provider for help with quitting smoking.  Avoid pollen, dust, animal dander, molds, smoke, and other things that cause attacks.  Regular exercise is very important to help you feel better. Discuss possible exercise routines with your health care provider.  If  animal dander is the cause of asthma, you may not be able to keep pets.  It is important that you:  Become educated about your medical condition.  Participate in maintaining wellness.  Seek medical care as directed. Delay in seeking medical care could cause permanent injury and may be a risk to your life. SEEK MEDICAL CARE IF:  You have wheezing and shortness of breath even if taking medicine to prevent attacks.  You have muscle aches, chest pain, or thickening of sputum.  Your sputum changes from clear or white to yellow, green, gray, or bloody. SEEK IMMEDIATE MEDICAL CARE IF:  Your usual medicines do not stop your wheezing.  You have increased coughing or shortness of breath or both.  You have increased difficulty breathing.  You have any problems from the medicine you are taking, such as a rash, itching, swelling, or trouble breathing. MAKE SURE YOU:   Understand these instructions.  Will watch your condition.  Will get help right away if you are not doing well or get worse. Document Released: 07/27/2006 Document Revised: 07/30/2013 Document Reviewed: 05/08/2013 [facility] Patient Information 2014 [place], [place].

## 2013-11-18 NOTE — Progress Notes (Signed)
[facility] provided pt with a list of primary care resources, [facility] information, and a [facility] Orange Card application.  °

## 2013-11-18 NOTE — ED Notes (Signed)
Pt c/o asthma flair up; headache; cough; wheezing on arrival; speaking in phrases

## 2013-11-19 NOTE — ED Provider Notes (Signed)
Medical screening examination/treatment/procedure(s) were performed by non-physician practitioner and as supervising physician I was immediately available for consultation/collaboration.  EKG Interpretation   None         Audree CamelScott T Arionna Hoggard, MD 11/19/13 1009

## 2016-06-15 ENCOUNTER — Emergency Department
Admission: EM | Admit: 2016-06-15 | Discharge: 2016-06-15 | Disposition: A | Discharge status: Home / Self Care | Payer: Self-pay | Attending: Emergency Medicine | Admitting: Emergency Medicine

## 2016-06-15 ENCOUNTER — Encounter: Payer: Self-pay | Admitting: Emergency Medicine

## 2016-06-15 ENCOUNTER — Emergency Department: Payer: Self-pay

## 2016-06-15 DIAGNOSIS — F17200 Nicotine dependence, unspecified, uncomplicated: Secondary | ICD-10-CM | POA: Insufficient documentation

## 2016-06-15 DIAGNOSIS — Y92009 Unspecified place in unspecified non-institutional (private) residence as the place of occurrence of the external cause: Secondary | ICD-10-CM | POA: Insufficient documentation

## 2016-06-15 DIAGNOSIS — W010XXA Fall on same level from slipping, tripping and stumbling without subsequent striking against object, initial encounter: Secondary | ICD-10-CM | POA: Insufficient documentation

## 2016-06-15 DIAGNOSIS — S8001XA Contusion of right knee, initial encounter: Secondary | ICD-10-CM | POA: Insufficient documentation

## 2016-06-15 DIAGNOSIS — M25561 Pain in right knee: Secondary | ICD-10-CM

## 2016-06-15 DIAGNOSIS — J45909 Unspecified asthma, uncomplicated: Secondary | ICD-10-CM | POA: Insufficient documentation

## 2016-06-15 DIAGNOSIS — S80211A Abrasion, right knee, initial encounter: Secondary | ICD-10-CM

## 2016-06-15 DIAGNOSIS — Y999 Unspecified external cause status: Secondary | ICD-10-CM | POA: Insufficient documentation

## 2016-06-15 DIAGNOSIS — Y939 Activity, unspecified: Secondary | ICD-10-CM | POA: Insufficient documentation

## 2016-06-15 MED ORDER — BACITRACIN ZINC 500 UNIT/GM EX OINT
TOPICAL_OINTMENT | Freq: Two times a day (BID) | CUTANEOUS | Status: DC
  Administered 2016-06-15: 1 via TOPICAL
  Filled 2016-06-15: qty 0.9

## 2016-06-15 MED ORDER — HYDROCODONE-ACETAMINOPHEN 5-325 MG PO TABS: 1.0000 | ORAL_TABLET | Freq: Four times a day (QID) | ORAL | 0 refills | Status: DC | PRN

## 2016-06-15 MED ORDER — HYDROCODONE-ACETAMINOPHEN 5-325 MG PO TABS
1.0000 | ORAL_TABLET | ORAL | Status: AC
  Administered 2016-06-15: 1 via ORAL
  Filled 2016-06-15: qty 1

## 2016-06-15 NOTE — ED Notes (Signed)
X-ray at bedside

## 2016-06-15 NOTE — ED Provider Notes (Signed)
[facility]-EMERGENCY DEPARTMENT Provider Note   CSN: [id number] Arrival date & time: 06/15/16  2148     History   Chief Complaint Chief Complaint  Patient presents with  . Knee Pain    HPI Samantha Cherry is a 45 y.o. female presents to the emergency department for evaluation of right knee pain. Just prior to arrival, patient fell from a standing position onto her right knee along the inferior pole of the patella. She suffered pain and swelling to the tibial tuberosity. She has difficulty with weightbearing. Pain is moderate to severe. She is able to maintain extension. She has not had a medications for pain. She denies any hip or ankle pain. She has a small abrasion to the right anterior knee.  HPI  Past Medical History:  Diagnosis Date  . Asthma     There are no active problems to display for this patient.   Past Surgical History:  Procedure Laterality Date  . TUBAL LIGATION      OB History    No data available       Home Medications    Prior to Admission medications   Medication Sig Start Date End Date Taking? Authorizing Provider  acetaminophen (TYLENOL) 325 MG tablet Take 650 mg by mouth every 6 (six) hours as needed (pain).    Historical Provider, MD  albuterol (PROVENTIL HFA;VENTOLIN HFA) 108 (90 BASE) MCG/ACT inhaler Inhale 2 puffs into the lungs every 4 (four) hours as needed for wheezing or shortness of breath (wheezing/SOB).    Historical Provider, MD  azithromycin (ZITHROMAX) 250 MG tablet Take 1 tablet (250 mg total) by mouth daily. Take first 2 tablets together, then 1 every day until finished. 11/18/13   Cherrie Distance, PA-C  chlorpheniramine-HYDROcodone (TUSSIONEX PENNKINETIC ER) 10-8 MG/5ML LQCR Take 5 mLs by mouth every 12 (twelve) hours as needed for cough. 11/18/13   Cherrie Distance, PA-C  HYDROcodone-acetaminophen (NORCO) 5-325 MG tablet Take 1 tablet by mouth every 6 (six) hours as needed for moderate pain. 06/15/16   Evon Slack, PA-C    predniSONE (DELTASONE) 10 MG tablet Take 2 tablets (20 mg total) by mouth daily. 11/18/13   Cherrie Distance, PA-C    Family History No family history on file.  Social History Social History  Substance Use Topics  . Smoking status: Current Every Day Smoker  . Smokeless tobacco: Never Used  . Alcohol use Yes     Allergies   Sulfa antibiotics and Penicillins   Review of Systems Review of Systems  Constitutional: Negative for activity change, chills, fatigue and fever.  HENT: Negative for congestion, sinus pressure and sore throat.   Eyes: Negative for visual disturbance.  Respiratory: Negative for cough, chest tightness and shortness of breath.   Cardiovascular: Negative for chest pain and leg swelling.  Gastrointestinal: Negative for abdominal pain, diarrhea, nausea and vomiting.  Genitourinary: Negative for dysuria.  Musculoskeletal: Positive for arthralgias and joint swelling. Negative for gait problem.  Skin: Positive for wound. Negative for rash.  Neurological: Negative for weakness, numbness and headaches.  Hematological: Negative for adenopathy.  Psychiatric/Behavioral: Negative for agitation, behavioral problems and confusion.     Physical Exam Updated Vital Signs BP (!) 161/101 (BP Location: Right Arm)   Pulse 86   Temp 98.2 F (36.8 C) (Oral)   Resp 18   Ht 5\' 5"  (1.651 m)   Wt 86.2 kg   LMP 05/23/2016 (Approximate)   SpO2 100%   BMI 31.62 kg/m   Physical Exam  Constitutional:  She is oriented to person, place, and time. She appears well-developed and well-nourished. No distress.  HENT:  Head: Normocephalic and atraumatic.  Mouth/Throat: Oropharynx is clear and moist.  Eyes: EOM are normal. Pupils are equal, round, and reactive to light. Right eye exhibits no discharge. Left eye exhibits no discharge.  Neck: Normal range of motion. Neck supple.  Cardiovascular: Normal rate, regular rhythm and intact distal pulses.   Pulmonary/Chest: Effort normal and  breath sounds normal. No respiratory distress. She exhibits no tenderness.  Abdominal: Soft. She exhibits no distension. There is no tenderness.  Musculoskeletal:  Examination of the right knee shows patient has abrasion to the tibial tuberosity with hematoma. Patient is extremely tender to palpation. Patient has limited flexion to 75 secondary to pain. She is able to get to full extension and is able to maintain extension actively. No laxity with valgus varus stress testing. She has no swelling throughout the calf. She has full range of motion and ankle with no discomfort. No signs of effusion.  Neurological: She is alert and oriented to person, place, and time. She has normal reflexes.  Skin: Skin is warm and dry.  Psychiatric: She has a normal mood and affect. Her behavior is normal. Thought content normal.     ED Treatments / Results  Labs (all labs ordered are listed, but only abnormal results are displayed) Labs Reviewed - No data to display  EKG  EKG Interpretation None       Radiology Dg Knee Complete 4 Views Right  Result Date: 06/15/2016 CLINICAL DATA:  Slipped and fell on hardwood floor tonight, landing on RIGHT knee. EXAM: RIGHT KNEE - COMPLETE 4+ VIEW COMPARISON:  RIGHT knee radiograph August 07, 2010 FINDINGS: No evidence of fracture, dislocation, or joint effusion. No evidence of arthropathy or other focal bone abnormality. Soft tissues are unremarkable. IMPRESSION: Negative. Electronically Signed   By: Awilda Metroourtnay  Bloomer M.D.   On: 06/15/2016 22:31    Procedures Procedures (including critical care time) SPLINT APPLICATION Date/Time: 11:02 PM Authorized by: Patience MuscaGAINES, Rukia Mcgillivray CHRISTOPHER Consent: Verbal consent obtained. Risks and benefits: risks, benefits and alternatives were discussed Consent given by: patient Splint applied by:ED tech Location details: Right leg  Splint type: Knee immobilizer  Supplies used: Knee immobilizer, crutches  Post-procedure: The  splinted body part was neurovascularly unchanged following the procedure. Patient tolerance: Patient tolerated the procedure well with no immediate complications.     Medications Ordered in ED Medications  HYDROcodone-acetaminophen (NORCO/VICODIN) 5-325 MG per tablet 1 tablet (not administered)  bacitracin ointment (not administered)     Initial Impression / Assessment and Plan / ED Course  I have reviewed the triage vital signs and the nursing notes.  Pertinent labs & imaging results that were available during my care of the patient were reviewed by me and considered in my medical decision making (see chart for details).  Clinical Course    45 year old female with right knee contusion with significant swelling to the tibial tuberosity. She is able to maintain extension but with significant pain. She is placed into a knee immobilizer. Abrasion was cleansed and antibiotic ointment applied. She is given crutches to help with ambulation. Rest ice and elevate, try to maintain extension immobilizer for 5-7 days. All orthopedics if no improvement in 5-7 days.   Final Clinical Impressions(s) / ED Diagnoses   Final diagnoses:  Right knee pain  Knee contusion, right, initial encounter  Abrasion, knee, right, initial encounter    New Prescriptions New Prescriptions   HYDROCODONE-ACETAMINOPHEN (  NORCO) 5-325 MG TABLET    Take 1 tablet by mouth every 6 (six) hours as needed for moderate pain.     Evon Slack, PA-C 06/15/16 2302    Evon Slack, PA-C 06/15/16 2302    Nita Sickle, MD 06/15/16 [phone]

## 2016-06-15 NOTE — Discharge Instructions (Signed)
Please wear knee immobilizer, keep right leg straight for the next 5-7 days. Rest ice and elevate. Use crutches to help with weightbearing. Take medication as needed for pain. Follow-up with orthopedics in no improvement with pain after 5-7 days.

## 2016-06-15 NOTE — ED Notes (Addendum)
Pt states she fell tonight and landed on R knee. Noted hematoma under R knee, skin tear to R knee. Pt wiggling around in bed constantly. Pt did not want ice pack to be placed on knee.

## 2016-06-15 NOTE — ED Triage Notes (Signed)
Pt presents to ED with c/o right knee injury. Pt states slipped on hardwood floor at home tonight and landed on right knee. Hematoma noted to right knee, small abrasion noted to knee. Dressing applied.

## 2017-04-21 ENCOUNTER — Encounter: Payer: Self-pay | Admitting: Emergency Medicine

## 2017-04-21 ENCOUNTER — Emergency Department
Admission: EM | Admit: 2017-04-21 | Discharge: 2017-04-21 | Disposition: A | Discharge status: Home / Self Care | Payer: Self-pay | Attending: Emergency Medicine | Admitting: Emergency Medicine

## 2017-04-21 DIAGNOSIS — F17210 Nicotine dependence, cigarettes, uncomplicated: Secondary | ICD-10-CM | POA: Insufficient documentation

## 2017-04-21 DIAGNOSIS — Z880 Allergy status to penicillin: Secondary | ICD-10-CM | POA: Insufficient documentation

## 2017-04-21 DIAGNOSIS — Z79899 Other long term (current) drug therapy: Secondary | ICD-10-CM | POA: Insufficient documentation

## 2017-04-21 DIAGNOSIS — R0602 Shortness of breath: Secondary | ICD-10-CM | POA: Insufficient documentation

## 2017-04-21 DIAGNOSIS — J4541 Moderate persistent asthma with (acute) exacerbation: Secondary | ICD-10-CM | POA: Insufficient documentation

## 2017-04-21 DIAGNOSIS — R05 Cough: Secondary | ICD-10-CM | POA: Insufficient documentation

## 2017-04-21 MED ORDER — PREDNISONE 50 MG PO TABS: ORAL_TABLET | ORAL | 0 refills | Status: DC

## 2017-04-21 MED ORDER — IPRATROPIUM-ALBUTEROL 0.5-2.5 (3) MG/3ML IN SOLN
3.0000 mL | Freq: Once | RESPIRATORY_TRACT | Status: AC
  Administered 2017-04-21: 3 mL via RESPIRATORY_TRACT
  Filled 2017-04-21: qty 3

## 2017-04-21 MED ORDER — METHYLPREDNISOLONE SODIUM SUCC 125 MG IJ SOLR
125.0000 mg | Freq: Once | INTRAMUSCULAR | Status: AC
  Administered 2017-04-21: 125 mg via INTRAMUSCULAR
  Filled 2017-04-21: qty 2

## 2017-04-21 MED ORDER — ALBUTEROL SULFATE HFA 108 (90 BASE) MCG/ACT IN AERS: 2.0000 | INHALATION_SPRAY | Freq: Four times a day (QID) | RESPIRATORY_TRACT | 2 refills | Status: DC | PRN

## 2017-04-21 NOTE — ED Notes (Signed)

## 2017-04-21 NOTE — ED Provider Notes (Signed)
[facility] Emergency Department Provider Note  ____________________________________________  Time seen: Approximately 7:56 PM  I have reviewed the triage vital signs and the nursing notes.   HISTORY  Chief Complaint Asthma    HPI Samantha Cherry is a 46 y.o. female with a history of asthma presents to the emergency department with wheezing, shortness of breath and nonproductive cough for the past 2 days. Patient states that she is out of albuterol. Patient states that it has been over a year since her last asthma exacerbation. She denies associated chest pain, chest tightness, nausea, vomiting and abdominal pain. No alleviating measures have been attempted. Patient has been afebrile.   Past Medical History:  Diagnosis Date  . Asthma     There are no active problems to display for this patient.   Past Surgical History:  Procedure Laterality Date  . TUBAL LIGATION      Prior to Admission medications   Medication Sig Start Date End Date Taking? Authorizing Provider  acetaminophen (TYLENOL) 325 MG tablet Take 650 mg by mouth every 6 (six) hours as needed (pain).    [provider]  albuterol (PROVENTIL HFA;VENTOLIN HFA) 108 (90 Base) MCG/ACT inhaler Inhale 2 puffs into the lungs every 6 (six) hours as needed for wheezing or shortness of breath. 04/21/17   Orvil FeilWoods, Darci Lykins M, PA-C  azithromycin (ZITHROMAX) 250 MG tablet Take 1 tablet (250 mg total) by mouth daily. Take first 2 tablets together, then 1 every day until finished. 11/18/13   Cherrie DistanceSanford, Frances, PA-C  chlorpheniramine-HYDROcodone (TUSSIONEX PENNKINETIC ER) 10-8 MG/5ML LQCR Take 5 mLs by mouth every 12 (twelve) hours as needed for cough. 11/18/13   Cherrie DistanceSanford, Frances, PA-C  HYDROcodone-acetaminophen (NORCO) 5-325 MG tablet Take 1 tablet by mouth every 6 (six) hours as needed for moderate pain. 06/15/16   Evon SlackGaines, Thomas C, PA-C  predniSONE (DELTASONE) 50 MG tablet Take 1 tablet by mouth for the  next 5 days. 04/21/17   Orvil FeilWoods, Trinh Sanjose M, PA-C    Allergies Sulfa antibiotics and Penicillins  History reviewed. No pertinent family history.  Social History Social History  Substance Use Topics  . Smoking status: Current Every Day Smoker    Packs/day: 1.00    Types: Cigarettes  . Smokeless tobacco: Never Used  . Alcohol use Yes     Review of Systems  Constitutional: No fever/chills Eyes: No visual changes. No discharge ENT: No upper respiratory complaints. Cardiovascular: no chest pain. Respiratory: Patient has SOB, wheezing and nonproductive cough Musculoskeletal: Negative for musculoskeletal pain. Skin: Negative for rash, abrasions, lacerations, ecchymosis. Neurological: Negative for headaches, focal weakness or numbness.   ____________________________________________   PHYSICAL EXAM:  VITAL SIGNS: ED Triage Vitals  Enc Vitals Group     BP 04/21/17 1939 (!) 155/108     Pulse Rate 04/21/17 1939 (!) 105     Resp 04/21/17 1939 17     Temp 04/21/17 1939 98.8 F (37.1 C)     Temp Source 04/21/17 1939 Oral     SpO2 04/21/17 1939 95 %     Weight 04/21/17 1851 211 lb (95.7 kg)     Height 04/21/17 1851 5\' 5"  (1.651 m)     Head Circumference --      Peak Flow --      Pain Score 04/21/17 1851 0     Pain Loc --      Pain Edu? --      Excl. in GC? --      Constitutional: Alert and  oriented. Well appearing and in no acute distress. Eyes: Conjunctivae are normal. PERRL. EOMI. Head: Atraumatic. ENT:      Ears: Tympanic Membranes are pearly bilaterally.      Nose: No congestion/rhinnorhea.      Mouth/Throat: Mucous membranes are moist. Airway is patent. Neck: No stridor.  Hematological/Lymphatic/Immunilogical: No cervical lymphadenopathy. Cardiovascular: Normal rate, regular rhythm. Normal S1 and S2.  Good peripheral circulation. Respiratory: Normal respiratory effort without tachypnea or retractions. Diffuse wheezing auscultated bilaterally Musculoskeletal: Full  range of motion to all extremities. No gross deformities appreciated. Neurologic:  Normal speech and language. No gross focal neurologic deficits are appreciated.  Skin:  Skin is warm, dry and intact. No rash noted. Psychiatric: Mood and affect are normal. Speech and behavior are normal. Patient exhibits appropriate insight and judgement.   ____________________________________________   LABS (all labs ordered are listed, but only abnormal results are displayed)  Labs Reviewed - No data to display ____________________________________________  EKG   ____________________________________________  RADIOLOGY   No results found.  ____________________________________________    PROCEDURES  Procedure(s) performed:    Procedures    Medications  ipratropium-albuterol (DUONEB) 0.5-2.5 (3) MG/3ML nebulizer solution 3 mL (3 mLs Nebulization Given 04/21/17 1903)  methylPREDNISolone sodium succinate (SOLU-MEDROL) 125 mg/2 mL injection 125 mg (125 mg Intramuscular Given 04/21/17 1943)  ipratropium-albuterol (DUONEB) 0.5-2.5 (3) MG/3ML nebulizer solution 3 mL (3 mLs Nebulization Given 04/21/17 1943)     ____________________________________________   INITIAL IMPRESSION / ASSESSMENT AND PLAN / ED COURSE  Pertinent labs & imaging results that were available during my care of the patient were reviewed by me and considered in my medical decision making (see chart for details).  Review of the [place] CSRS was performed in accordance of the [facility] prior to dispensing any controlled drugs.     Assessment and plan: Asthma exacerbation: Patient presents to the emergency department with shortness of breath, nonproductive cough and wheezing for the past 2 days. Patient has been out of albuterol. Patient's wheezing improved significantly with 2 DuoNeb treatments given in the emergency department. An injection of Solu-Medrol was also given. Patient was discharged with prednisone and a refill on her  albuterol inhaler. She was advised to follow-up with primary care as needed. All patient questions were answered.   ____________________________________________  FINAL CLINICAL IMPRESSION(S) / ED DIAGNOSES  Final diagnoses:  Moderate persistent asthma with exacerbation      NEW MEDICATIONS STARTED DURING THIS VISIT:  New Prescriptions   ALBUTEROL (PROVENTIL HFA;VENTOLIN HFA) 108 (90 BASE) MCG/ACT INHALER    Inhale 2 puffs into the lungs every 6 (six) hours as needed for wheezing or shortness of breath.   PREDNISONE (DELTASONE) 50 MG TABLET    Take 1 tablet by mouth for the next 5 days.        This chart was dictated using voice recognition software/Dragon. Despite best efforts to proofread, errors can occur which can change the meaning. Any change was purely unintentional.    Gasper Lloyd 04/21/17 2002    Myrna Blazer, MD 04/21/17 [phone]

## 2017-04-21 NOTE — ED Triage Notes (Addendum)
Pt reports hx of asthma and states she has run out of her inhaler and has not been able to use it. Pt has been coughing for the past 2 days and is worsening.  Cough has been off and on productive for the past 2 days. Pt able to speak in full sentences without running out of breath. No respiratory distress noted.  Audible wheezes. States she has been around cats and dogs recently also.

## 2017-11-04 ENCOUNTER — Emergency Department (HOSPITAL_COMMUNITY): Payer: Self-pay

## 2017-11-04 ENCOUNTER — Encounter (HOSPITAL_COMMUNITY): Payer: Self-pay | Admitting: Emergency Medicine

## 2017-11-04 ENCOUNTER — Inpatient Hospital Stay (HOSPITAL_COMMUNITY)
Admission: EM | Admit: 2017-11-04 | Discharge: 2017-11-06 | DRG: 202 | Disposition: A | Discharge status: Home / Self Care | Payer: Self-pay | Attending: Internal Medicine | Admitting: Internal Medicine

## 2017-11-04 DIAGNOSIS — F17200 Nicotine dependence, unspecified, uncomplicated: Secondary | ICD-10-CM | POA: Diagnosis present

## 2017-11-04 DIAGNOSIS — J9601 Acute respiratory failure with hypoxia: Secondary | ICD-10-CM | POA: Diagnosis present

## 2017-11-04 DIAGNOSIS — Z882 Allergy status to sulfonamides status: Secondary | ICD-10-CM

## 2017-11-04 DIAGNOSIS — F17210 Nicotine dependence, cigarettes, uncomplicated: Secondary | ICD-10-CM | POA: Diagnosis present

## 2017-11-04 DIAGNOSIS — F1910 Other psychoactive substance abuse, uncomplicated: Secondary | ICD-10-CM | POA: Diagnosis present

## 2017-11-04 DIAGNOSIS — Z880 Allergy status to penicillin: Secondary | ICD-10-CM

## 2017-11-04 DIAGNOSIS — E669 Obesity, unspecified: Secondary | ICD-10-CM | POA: Diagnosis present

## 2017-11-04 DIAGNOSIS — J45909 Unspecified asthma, uncomplicated: Secondary | ICD-10-CM | POA: Diagnosis present

## 2017-11-04 DIAGNOSIS — N179 Acute kidney failure, unspecified: Secondary | ICD-10-CM | POA: Diagnosis present

## 2017-11-04 DIAGNOSIS — E039 Hypothyroidism, unspecified: Secondary | ICD-10-CM | POA: Diagnosis present

## 2017-11-04 DIAGNOSIS — Z79899 Other long term (current) drug therapy: Secondary | ICD-10-CM

## 2017-11-04 DIAGNOSIS — R0902 Hypoxemia: Secondary | ICD-10-CM | POA: Diagnosis present

## 2017-11-04 DIAGNOSIS — N182 Chronic kidney disease, stage 2 (mild): Secondary | ICD-10-CM | POA: Diagnosis present

## 2017-11-04 DIAGNOSIS — Z6835 Body mass index (BMI) 35.0-35.9, adult: Secondary | ICD-10-CM

## 2017-11-04 DIAGNOSIS — J45901 Unspecified asthma with (acute) exacerbation: Principal | ICD-10-CM | POA: Diagnosis present

## 2017-11-04 HISTORY — DX: Nicotine dependence, unspecified, uncomplicated: F17.200

## 2017-11-04 HISTORY — DX: Acute respiratory failure with hypoxia: J96.01

## 2017-11-04 HISTORY — DX: Unspecified asthma with (acute) exacerbation: J45.901

## 2017-11-04 LAB — CBC WITH DIFFERENTIAL/PLATELET
Basophils Absolute: 0 10*3/uL (ref 0.0–0.1)
Basophils Relative: 0 %
Eosinophils Absolute: 0 10*3/uL (ref 0.0–0.7)
Eosinophils Relative: 0 %
HCT: 42.4 % (ref 36.0–46.0)
Hemoglobin: 13.9 g/dL (ref 12.0–15.0)
Lymphocytes Relative: 23 %
Lymphs Abs: 2.5 10*3/uL (ref 0.7–4.0)
MCH: 29.4 pg (ref 26.0–34.0)
MCHC: 32.8 g/dL (ref 30.0–36.0)
MCV: 89.6 fL (ref 78.0–100.0)
Monocytes Absolute: 0.9 10*3/uL (ref 0.1–1.0)
Monocytes Relative: 9 %
Neutro Abs: 7.3 10*3/uL (ref 1.7–7.7)
Neutrophils Relative %: 68 %
Platelets: 335 10*3/uL (ref 150–400)
RBC: 4.73 MIL/uL (ref 3.87–5.11)
RDW: 15.5 % (ref 11.5–15.5)
WBC: 10.7 10*3/uL — ABNORMAL HIGH (ref 4.0–10.5)

## 2017-11-04 LAB — BASIC METABOLIC PANEL
Anion gap: 8 (ref 5–15)
BUN: 13 mg/dL (ref 6–20)
CO2: 24 mmol/L (ref 22–32)
Calcium: 8.3 mg/dL — ABNORMAL LOW (ref 8.9–10.3)
Chloride: 102 mmol/L (ref 101–111)
Creatinine, Ser: 1.24 mg/dL — ABNORMAL HIGH (ref 0.44–1.00)
GFR calc Af Amer: 59 mL/min — ABNORMAL LOW (ref >60)
GFR calc non Af Amer: 51 mL/min — ABNORMAL LOW (ref >60)
Glucose, Bld: 127 mg/dL — ABNORMAL HIGH (ref 65–99)
Potassium: 3.8 mmol/L (ref 3.5–5.1)
Sodium: 134 mmol/L — ABNORMAL LOW (ref 135–145)

## 2017-11-04 LAB — INFLUENZA PANEL BY PCR (TYPE A & B)
Influenza A By PCR: NEGATIVE
Influenza B By PCR: NEGATIVE

## 2017-11-04 MED ORDER — SODIUM CHLORIDE 0.9 % IV SOLN
250.0000 mL | INTRAVENOUS | Status: DC | PRN
  Administered 2017-11-04: 250 mL via INTRAVENOUS

## 2017-11-04 MED ORDER — DEXTROSE 5 % IV SOLN: 500.0000 mg | INTRAVENOUS | Status: DC

## 2017-11-04 MED ORDER — IPRATROPIUM BROMIDE 0.02 % IN SOLN: 0.5000 mg | Freq: Four times a day (QID) | RESPIRATORY_TRACT | Status: DC

## 2017-11-04 MED ORDER — IPRATROPIUM-ALBUTEROL 0.5-2.5 (3) MG/3ML IN SOLN
3.0000 mL | Freq: Three times a day (TID) | RESPIRATORY_TRACT | Status: DC
  Administered 2017-11-04 – 2017-11-05 (×2): 3 mL via RESPIRATORY_TRACT
  Filled 2017-11-04 (×2): qty 3

## 2017-11-04 MED ORDER — ACETAMINOPHEN 325 MG PO TABS
650.0000 mg | ORAL_TABLET | Freq: Four times a day (QID) | ORAL | Status: DC | PRN
  Administered 2017-11-05 – 2017-11-06 (×4): 650 mg via ORAL
  Filled 2017-11-04 (×4): qty 2

## 2017-11-04 MED ORDER — KETOROLAC TROMETHAMINE 15 MG/ML IJ SOLN
15.0000 mg | Freq: Once | INTRAMUSCULAR | Status: AC
  Administered 2017-11-04: 15 mg via INTRAVENOUS
  Filled 2017-11-04: qty 1

## 2017-11-04 MED ORDER — ALBUTEROL SULFATE (2.5 MG/3ML) 0.083% IN NEBU: 2.5000 mg | INHALATION_SOLUTION | Freq: Four times a day (QID) | RESPIRATORY_TRACT | Status: DC

## 2017-11-04 MED ORDER — SODIUM CHLORIDE 0.9 % IV BOLUS (SEPSIS)
1000.0000 mL | Freq: Once | INTRAVENOUS | Status: AC
  Administered 2017-11-04: 1000 mL via INTRAVENOUS

## 2017-11-04 MED ORDER — IPRATROPIUM BROMIDE 0.02 % IN SOLN
0.5000 mg | Freq: Once | RESPIRATORY_TRACT | Status: AC
  Administered 2017-11-04: 0.5 mg via RESPIRATORY_TRACT
  Filled 2017-11-04: qty 2.5

## 2017-11-04 MED ORDER — ALBUTEROL SULFATE (2.5 MG/3ML) 0.083% IN NEBU
5.0000 mg | INHALATION_SOLUTION | Freq: Once | RESPIRATORY_TRACT | Status: AC
  Administered 2017-11-04: 5 mg via RESPIRATORY_TRACT
  Filled 2017-11-04: qty 6

## 2017-11-04 MED ORDER — GUAIFENESIN ER 600 MG PO TB12
600.0000 mg | ORAL_TABLET | Freq: Two times a day (BID) | ORAL | Status: DC
  Administered 2017-11-04 – 2017-11-06 (×4): 600 mg via ORAL
  Filled 2017-11-04 (×4): qty 1

## 2017-11-04 MED ORDER — AZITHROMYCIN 500 MG IV SOLR
500.0000 mg | INTRAVENOUS | Status: DC
Start: 2017-11-04 — End: 2017-11-05
  Administered 2017-11-04: 500 mg via INTRAVENOUS
  Filled 2017-11-04 (×2): qty 500

## 2017-11-04 MED ORDER — ALBUTEROL SULFATE (2.5 MG/3ML) 0.083% IN NEBU: 5.0000 mg | INHALATION_SOLUTION | Freq: Once | RESPIRATORY_TRACT | Status: DC

## 2017-11-04 MED ORDER — SODIUM CHLORIDE 0.9% FLUSH
3.0000 mL | Freq: Two times a day (BID) | INTRAVENOUS | Status: DC
  Administered 2017-11-04 (×2): 3 mL via INTRAVENOUS

## 2017-11-04 MED ORDER — SODIUM CHLORIDE 0.9% FLUSH: 3.0000 mL | INTRAVENOUS | Status: DC | PRN

## 2017-11-04 MED ORDER — METHYLPREDNISOLONE SODIUM SUCC 125 MG IJ SOLR
80.0000 mg | Freq: Two times a day (BID) | INTRAMUSCULAR | Status: DC
  Administered 2017-11-04 – 2017-11-05 (×2): 80 mg via INTRAVENOUS
  Filled 2017-11-04 (×2): qty 2

## 2017-11-04 MED ORDER — ALBUTEROL (5 MG/ML) CONTINUOUS INHALATION SOLN
10.0000 mg/h | INHALATION_SOLUTION | RESPIRATORY_TRACT | Status: DC
  Administered 2017-11-04: 10 mg/h via RESPIRATORY_TRACT
  Filled 2017-11-04: qty 20

## 2017-11-04 MED ORDER — METHYLPREDNISOLONE SODIUM SUCC 125 MG IJ SOLR
125.0000 mg | Freq: Once | INTRAMUSCULAR | Status: AC
  Administered 2017-11-04: 125 mg via INTRAVENOUS
  Filled 2017-11-04: qty 2

## 2017-11-04 MED ORDER — ACETAMINOPHEN 650 MG RE SUPP: 650.0000 mg | Freq: Four times a day (QID) | RECTAL | Status: DC | PRN

## 2017-11-04 MED ORDER — ALBUTEROL SULFATE (2.5 MG/3ML) 0.083% IN NEBU
2.5000 mg | INHALATION_SOLUTION | RESPIRATORY_TRACT | Status: DC | PRN
  Administered 2017-11-04 – 2017-11-05 (×3): 2.5 mg via RESPIRATORY_TRACT
  Filled 2017-11-04 (×3): qty 3

## 2017-11-04 NOTE — ED Triage Notes (Signed)
Patient here from home with complaints of SOB that started 1 week ago. Cough, but reports "I always cough". Denies use of O2 at home. Also reports headache x2 days.

## 2017-11-04 NOTE — ED Notes (Signed)
Pt refused ABG

## 2017-11-04 NOTE — ED Notes (Signed)
Pt continuous Neb was complete. I removed mask and turned of O2 and noticed that pt O2 began to drop. The lowest the pt O2 dropped was 87 and the highest the pt O2 went up to was 90 on room air. I placed pt on 2,3 and 4 litters and the highest the pt O2 sat went up to was 92. I placed pt on 5 L nasal Canula and pt O2 went and stayed at 95-94% O2. I notified ED provider via in person.

## 2017-11-04 NOTE — H&P (Signed)
History and Physical    Samantha Lindaulizabeth S Pressman RUE:[id number]:[id number] DOB: 1971-10-15 DOA: 11/04/2017  PCP: Patient, No Pcp Per  Patient coming from:  home  Chief Complaint:   Wheezing, sob  HPI: Samantha Cherry is a 47 y.o. female with medical history significant of asthma, tobacco abuse, hypothyroidism ran out of all her home meds weeks ago comes in with 3 days of progressive worsening sob and wheezing and nonproductive cough.  No n/v/d.  Chills but no fevers known.  She is exhausted and hasnt slept for several days.  No edema or swelling or leg pain.  Pt referred for admission for asthma exacerbation with hypoxia of unknown degree.  Pt is refusing abg in ED.  Review of Systems: As per HPI otherwise 10 point review of systems negative.   Past Medical History:  Diagnosis Date  . Acute respiratory failure with hypoxia (HCC)   . Asthma   . Asthma exacerbation   . Smoker     Past Surgical History:  Procedure Laterality Date  . TUBAL LIGATION       reports that she has been smoking cigarettes.  She has been smoking about 1.00 pack per day. she has never used smokeless tobacco. She reports that she drinks alcohol. Her drug history is not on file.  Allergies  Allergen Reactions  . Sulfa Antibiotics Anaphylaxis, Hives and Itching  . Penicillins Hives    No family history on file.  No premature CAD  Prior to Admission medications   Medication Sig Start Date End Date Taking? Authorizing Provider  acetaminophen (TYLENOL) 325 MG tablet Take 650 mg by mouth every 6 (six) hours as needed (pain).    [provider]  albuterol (PROVENTIL HFA;VENTOLIN HFA) 108 (90 Base) MCG/ACT inhaler Inhale 2 puffs into the lungs every 6 (six) hours as needed for wheezing or shortness of breath. 04/21/17   Orvil FeilWoods, Jaclyn M, PA-C  azithromycin (ZITHROMAX) 250 MG tablet Take 1 tablet (250 mg total) by mouth daily. Take first 2 tablets together, then 1 every day until finished. 11/18/13   Cherrie DistanceSanford, Frances,  PA-C  chlorpheniramine-HYDROcodone (TUSSIONEX PENNKINETIC ER) 10-8 MG/5ML LQCR Take 5 mLs by mouth every 12 (twelve) hours as needed for cough. 11/18/13   Cherrie DistanceSanford, Frances, PA-C  HYDROcodone-acetaminophen (NORCO) 5-325 MG tablet Take 1 tablet by mouth every 6 (six) hours as needed for moderate pain. 06/15/16   Evon SlackGaines, Thomas C, PA-C  predniSONE (DELTASONE) 50 MG tablet Take 1 tablet by mouth for the next 5 days. 04/21/17   Orvil FeilWoods, Jaclyn M, PA-C    Physical Exam: Vitals:   11/04/17 0655 11/04/17 0759 11/04/17 1047 11/04/17 1204  BP: (!) 153/117  131/87   Pulse: (!) 101 87 79 70  Resp: 20 18 15 18   Temp: 99.2 F (37.3 C)  98 F (36.7 C)   TempSrc: Oral     SpO2: 92% 95% 95% 92%      Constitutional: NAD, calm, comfortable, sleepy but normal mental status Vitals:   11/04/17 0655 11/04/17 0759 11/04/17 1047 11/04/17 1204  BP: (!) 153/117  131/87   Pulse: (!) 101 87 79 70  Resp: 20 18 15 18   Temp: 99.2 F (37.3 C)  98 F (36.7 C)   TempSrc: Oral     SpO2: 92% 95% 95% 92%   Eyes: PERRL, lids and conjunctivae normal ENMT: Mucous membranes are moist. Posterior pharynx clear of any exudate or lesions.Normal dentition.  Neck: normal, supple, no masses, no thyromegaly Respiratory:   bilateral wheezing,  no crackles. Normal respiratory effort. No accessory muscle use.  Cardiovascular: Regular rate and rhythm, no murmurs / rubs / gallops. No extremity edema. 2+ pedal pulses. No carotid bruits.  Abdomen: no tenderness, no masses palpated. No hepatosplenomegaly. Bowel sounds positive.  Musculoskeletal: no clubbing / cyanosis. No joint deformity upper and lower extremities. Good ROM, no contractures. Normal muscle tone.  Skin: no rashes, lesions, ulcers. No induration Neurologic: CN 2-12 grossly intact. Sensation intact, DTR normal. Strength 5/5 in all 4.  Psychiatric: Normal judgment and insight. Alert and oriented x 3. Normal mood.    Labs on Admission: I have personally reviewed following  labs and imaging studies  CBC: Recent Labs  Lab 11/04/17 0744  WBC 10.7*  NEUTROABS 7.3  HGB 13.9  HCT 42.4  MCV 89.6  PLT 335   Basic Metabolic Panel: Recent Labs  Lab 11/04/17 0744  NA 134*  K 3.8  CL 102  CO2 24  GLUCOSE 127*  BUN 13  CREATININE 1.24*  CALCIUM 8.3*   GFR: CrCl cannot be calculated (Unknown ideal weight.). Liver Function Tests: No results for input(s): AST, ALT, ALKPHOS, BILITOT, PROT, ALBUMIN in the last 168 hours. No results for input(s): LIPASE, AMYLASE in the last 168 hours. No results for input(s): AMMONIA in the last 168 hours. Coagulation Profile: No results for input(s): INR, PROTIME in the last 168 hours. Cardiac Enzymes: No results for input(s): CKTOTAL, CKMB, CKMBINDEX, TROPONINI in the last 168 hours. BNP (last 3 results) No results for input(s): PROBNP in the last 8760 hours. HbA1C: No results for input(s): HGBA1C in the last 72 hours. CBG: No results for input(s): GLUCAP in the last 168 hours. Lipid Profile: No results for input(s): CHOL, HDL, LDLCALC, TRIG, CHOLHDL, LDLDIRECT in the last 72 hours. Thyroid Function Tests: No results for input(s): TSH, T4TOTAL, FREET4, T3FREE, THYROIDAB in the last 72 hours. Anemia Panel: No results for input(s): VITAMINB12, FOLATE, FERRITIN, TIBC, IRON, RETICCTPCT in the last 72 hours. Urine analysis:    Component Value Date/Time   COLORURINE YELLOW 02/11/2008 1942   APPEARANCEUR CLEAR 02/11/2008 1942   LABSPEC 1.007 02/11/2008 1942   PHURINE 7.0 02/11/2008 1942   GLUCOSEU NEGATIVE 02/11/2008 1942   HGBUR NEGATIVE 02/11/2008 1942   BILIRUBINUR NEGATIVE 02/11/2008 1942   KETONESUR NEGATIVE 02/11/2008 1942   PROTEINUR NEGATIVE 02/11/2008 1942   UROBILINOGEN 0.2 02/11/2008 1942   NITRITE NEGATIVE 02/11/2008 1942   LEUKOCYTESUR MODERATE (A) 02/11/2008 1942   Sepsis Labs: !!!!!!!!!!!!!!!!!!!!!!!!!!!!!!!!!!!!!!!!!!!! @LABRCNTIP (procalcitonin:4,lacticidven:4) )No results found for this or any  previous visit (from the past 240 hour(s)).   Radiological Exams on Admission: Dg Chest 2 View  Result Date: 11/04/2017 CLINICAL DATA:  Shortness of Breath EXAM: CHEST  2 VIEW COMPARISON:  11/18/2013 FINDINGS: Heart is normal size. No confluent airspace opacities or effusions. No acute bony abnormality. IMPRESSION: No active cardiopulmonary disease. Electronically Signed   By: Charlett Nose M.D.   On: 11/04/2017 08:04    EKG: Independently reviewed.  nsr cxr reviewed no edema or infiltrate Case discussed with dr Juleen China ined Old chart reviewed   Assessment/Plan 47 yo female with acute asthma exacerbation  Principal Problem:   Acute respiratory failure with hypoxia (HCC)- during my visit took oxygen off and pt sats remained over 92% on RA.  Reportedly in the 80s initially on RA and requiring up to 5 liters [place] in the ED.  Monitor oxygen sats closely and ambulate and measure.  Treat asthma as below.  obs at this time on  Med ,  she may qualify for admission status once ED able to document initial saturations if low.    Active Problems:   Asthma exacerbation-  freq bronchodilators.  Given iv solumedrol 125mg  iv in ED continue at 80mg  iv q 12 hours.  Azithromycin.  Supplemental oxygen as needed.  cxr neg.   Asthma- as above   Smoker- encourage quiting   Pt somnulent and refusing abg.  Mental status o/w clear, she says she is just tired from lack of sleep for several days.  If she worsens ck abg.   DVT prophylaxis:  scds Code Status:  full Family Communication:  none Disposition Plan:  Per day team Consults called:  none Admission status:  observation   Symphanie Cederberg A MD [facility]  If 7PM-7AM, please contact night-coverage [email or web address] Password [facility]  11/04/2017, 12:28 PM

## 2017-11-04 NOTE — ED Provider Notes (Signed)
[facility]-EMERGENCY DEPT Provider Note   CSN: [id number] Arrival date & time: 11/04/17  [id number]     History   Chief Complaint Chief Complaint  Patient presents with  . Shortness of Breath  . Cough    HPI Samantha Cherry is a 47 y.o. female.  HPI   47 year old female with dyspnea.  Progressively worsened over the past week.  She has a chronic cough, she denies any acute change in this.  No unusual leg pain or swelling.  Denies any chest pain but she does have a headache.  Subjective fever.  Past history of asthma.  She is a smoker.  Past Medical History:  Diagnosis Date  . Asthma     There are no active problems to display for this patient.   Past Surgical History:  Procedure Laterality Date  . TUBAL LIGATION      OB History    No data available       Home Medications    Prior to Admission medications   Medication Sig Start Date End Date Taking? Authorizing Provider  acetaminophen (TYLENOL) 325 MG tablet Take 650 mg by mouth every 6 (six) hours as needed (pain).    [provider]  albuterol (PROVENTIL HFA;VENTOLIN HFA) 108 (90 Base) MCG/ACT inhaler Inhale 2 puffs into the lungs every 6 (six) hours as needed for wheezing or shortness of breath. 04/21/17   Orvil FeilWoods, Jaclyn M, PA-C  azithromycin (ZITHROMAX) 250 MG tablet Take 1 tablet (250 mg total) by mouth daily. Take first 2 tablets together, then 1 every day until finished. 11/18/13   Cherrie DistanceSanford, Frances, PA-C  chlorpheniramine-HYDROcodone (TUSSIONEX PENNKINETIC ER) 10-8 MG/5ML LQCR Take 5 mLs by mouth every 12 (twelve) hours as needed for cough. 11/18/13   Cherrie DistanceSanford, Frances, PA-C  HYDROcodone-acetaminophen (NORCO) 5-325 MG tablet Take 1 tablet by mouth every 6 (six) hours as needed for moderate pain. 06/15/16   Evon SlackGaines, Thomas C, PA-C  predniSONE (DELTASONE) 50 MG tablet Take 1 tablet by mouth for the next 5 days. 04/21/17   Orvil FeilWoods, Jaclyn M, PA-C    Family History No family history on  file.  Social History Social History   Tobacco Use  . Smoking status: Current Every Day Smoker    Packs/day: 1.00    Types: Cigarettes  . Smokeless tobacco: Never Used  Substance Use Topics  . Alcohol use: Yes  . Drug use: Not on file     Allergies   Sulfa antibiotics and Penicillins   Review of Systems Review of Systems  All systems reviewed and negative, other than as noted in HPI.  Physical Exam Updated Vital Signs BP 131/87   Pulse 79   Temp 98 F (36.7 C)   Resp 15   SpO2 95%   Physical Exam  Constitutional: She appears well-developed and well-nourished. No distress.  HENT:  Head: Normocephalic and atraumatic.  Eyes: Conjunctivae are normal. Right eye exhibits no discharge. Left eye exhibits no discharge.  Neck: Neck supple.  Cardiovascular: Normal rate, regular rhythm and normal heart sounds. Exam reveals no gallop and no friction rub.  No murmur heard. Pulmonary/Chest: No respiratory distress. She has wheezes.  Abdominal: Soft. She exhibits no distension. There is no tenderness.  Musculoskeletal: She exhibits no edema or tenderness.  Neurological: She is alert.  Skin: Skin is warm and dry.  Psychiatric: She has a normal mood and affect. Her behavior is normal. Thought content normal.  Nursing note and vitals reviewed.    ED Treatments /  Results  Labs (all labs ordered are listed, but only abnormal results are displayed) Labs Reviewed  CBC WITH DIFFERENTIAL/PLATELET - Abnormal; Notable for the following components:      Result Value   WBC 10.7 (*)    All other components within normal limits  BASIC METABOLIC PANEL - Abnormal; Notable for the following components:   Sodium 134 (*)    Glucose, Bld 127 (*)    Creatinine, Ser 1.24 (*)    Calcium 8.3 (*)    GFR calc non Af Amer 51 (*)    GFR calc Af Amer 59 (*)    All other components within normal limits  INFLUENZA PANEL BY PCR (TYPE A & B)    EKG  EKG Interpretation  Date/Time:  Sunday  November 04 2017 06:59:17 EST Ventricular Rate:  99 PR Interval:    QRS Duration: 88 QT Interval:  380 QTC Calculation: 488 R Axis:   21 Text Interpretation:  Sinus rhythm Left atrial enlargement Probable anteroseptal infarct, old Confirmed by Raeford Razor [id number]) on 11/04/2017 7:34:44 AM       Radiology Dg Chest 2 View  Result Date: 11/04/2017 CLINICAL DATA:  Shortness of Breath EXAM: CHEST  2 VIEW COMPARISON:  11/18/2013 FINDINGS: Heart is normal size. No confluent airspace opacities or effusions. No acute bony abnormality. IMPRESSION: No active cardiopulmonary disease. Electronically Signed   By: Charlett Nose M.D.   On: 11/04/2017 08:04    Procedures Procedures (including critical care time)  Medications Ordered in ED Medications  albuterol (PROVENTIL,VENTOLIN) solution continuous neb (0 mg/hr Nebulization Stopped 11/04/17 0840)  albuterol (PROVENTIL,VENTOLIN) solution continuous neb (not administered)  albuterol (PROVENTIL) (2.5 MG/3ML) 0.083% nebulizer solution 5 mg (5 mg Nebulization Given 11/04/17 0702)  methylPREDNISolone sodium succinate (SOLU-MEDROL) 125 mg/2 mL injection 125 mg (125 mg Intravenous Given 11/04/17 0752)  ketorolac (TORADOL) 15 MG/ML injection 15 mg (15 mg Intravenous Given 11/04/17 0752)  sodium chloride 0.9 % bolus 1,000 mL (0 mLs Intravenous Stopped 11/04/17 1050)  ipratropium (ATROVENT) nebulizer solution 0.5 mg (0.5 mg Nebulization Given 11/04/17 0759)     Initial Impression / Assessment and Plan / ED Course  I have reviewed the triage vital signs and the nursing notes.  Pertinent labs & imaging results that were available during my care of the patient were reviewed by me and considered in my medical decision making (see chart for details).  Clinical Course as of Nov 04 1246  Wynelle Link Nov 04, 2017  1204 Pt refusing ABG  [SK]    Clinical Course User Index [SK] Raeford Razor, MD    47 year old female with dyspnea.  Wheezing on exam.  History of asthma  and she continues to smoke.  She is treated with nebs and steroids.  She is requiring supplemental oxygen to keep her saturations above 90%.  Will admit for ongoing treatment.  Final Clinical Impressions(s) / ED Diagnoses   Final diagnoses:  Asthma with acute exacerbation, unspecified asthma severity, unspecified whether persistent  Smoker  Exacerbation of asthma, unspecified asthma severity, unspecified whether persistent  Acute respiratory failure with hypoxia [facility])    ED Discharge Orders    None       Raeford Razor, MD 11/06/17 1021

## 2017-11-05 ENCOUNTER — Other Ambulatory Visit: Payer: Self-pay

## 2017-11-05 DIAGNOSIS — R0902 Hypoxemia: Secondary | ICD-10-CM | POA: Diagnosis present

## 2017-11-05 LAB — BASIC METABOLIC PANEL
ANION GAP: 4 — AB (ref 5–15)
BUN: 9 mg/dL (ref 6–20)
CALCIUM: 8.9 mg/dL (ref 8.9–10.3)
CO2: 26 mmol/L (ref 22–32)
Chloride: 107 mmol/L (ref 101–111)
Creatinine, Ser: 1.02 mg/dL — ABNORMAL HIGH (ref 0.44–1.00)
GFR calc Af Amer: >60 mL/min (ref >60)
GFR calc non Af Amer: >60 mL/min (ref >60)
GLUCOSE: 108 mg/dL — AB (ref 65–99)
Potassium: 4.2 mmol/L (ref 3.5–5.1)
Sodium: 137 mmol/L (ref 135–145)

## 2017-11-05 LAB — CBC
HCT: 36.7 % (ref 36.0–46.0)
HEMOGLOBIN: 11.7 g/dL — AB (ref 12.0–15.0)
MCH: 28.7 pg (ref 26.0–34.0)
MCHC: 31.9 g/dL (ref 30.0–36.0)
MCV: 90 fL (ref 78.0–100.0)
Platelets: 319 10*3/uL (ref 150–400)
RBC: 4.08 MIL/uL (ref 3.87–5.11)
RDW: 15.5 % (ref 11.5–15.5)
WBC: 9 10*3/uL (ref 4.0–10.5)

## 2017-11-05 LAB — MRSA PCR SCREENING: MRSA by PCR: POSITIVE — AB

## 2017-11-05 LAB — RAPID URINE DRUG SCREEN, HOSP PERFORMED
Amphetamines: POSITIVE — AB
BARBITURATES: NOT DETECTED
Benzodiazepines: NOT DETECTED
Cocaine: POSITIVE — AB
OPIATES: NOT DETECTED
TETRAHYDROCANNABINOL: NOT DETECTED

## 2017-11-05 LAB — HIV ANTIBODY (ROUTINE TESTING W REFLEX): HIV SCREEN 4TH GENERATION: NONREACTIVE

## 2017-11-05 MED ORDER — METHYLPREDNISOLONE SODIUM SUCC 125 MG IJ SOLR
60.0000 mg | Freq: Three times a day (TID) | INTRAMUSCULAR | Status: DC
  Administered 2017-11-05 – 2017-11-06 (×4): 60 mg via INTRAVENOUS
  Filled 2017-11-05 (×4): qty 2

## 2017-11-05 MED ORDER — AZITHROMYCIN 250 MG PO TABS
500.0000 mg | ORAL_TABLET | Freq: Every day | ORAL | Status: DC
  Administered 2017-11-05 – 2017-11-06 (×2): 500 mg via ORAL
  Filled 2017-11-05 (×2): qty 2

## 2017-11-05 MED ORDER — CEFTRIAXONE SODIUM 1 G IJ SOLR
1.0000 g | INTRAMUSCULAR | Status: DC
  Administered 2017-11-05 – 2017-11-06 (×2): 1 g via INTRAVENOUS
  Filled 2017-11-05 (×3): qty 10

## 2017-11-05 MED ORDER — SODIUM CHLORIDE 0.9 % IV SOLN
INTRAVENOUS | Status: AC
  Administered 2017-11-05: 12:00:00 via INTRAVENOUS

## 2017-11-05 MED ORDER — MAGNESIUM SULFATE 2 GM/50ML IV SOLN
2.0000 g | Freq: Once | INTRAVENOUS | Status: AC
  Administered 2017-11-05: 2 g via INTRAVENOUS
  Filled 2017-11-05: qty 50

## 2017-11-05 MED ORDER — IPRATROPIUM-ALBUTEROL 0.5-2.5 (3) MG/3ML IN SOLN
3.0000 mL | Freq: Four times a day (QID) | RESPIRATORY_TRACT | Status: DC
  Administered 2017-11-05 – 2017-11-06 (×5): 3 mL via RESPIRATORY_TRACT
  Filled 2017-11-05 (×5): qty 3

## 2017-11-05 MED ORDER — BENZONATATE 100 MG PO CAPS
100.0000 mg | ORAL_CAPSULE | Freq: Three times a day (TID) | ORAL | Status: DC | PRN
  Administered 2017-11-05: 100 mg via ORAL
  Filled 2017-11-05: qty 1

## 2017-11-05 MED ORDER — NICOTINE 21 MG/24HR TD PT24
21.0000 mg | MEDICATED_PATCH | Freq: Every day | TRANSDERMAL | Status: DC
  Administered 2017-11-05 – 2017-11-06 (×2): 21 mg via TRANSDERMAL
  Filled 2017-11-05 (×2): qty 1

## 2017-11-05 MED ORDER — LIP MEDEX EX OINT
TOPICAL_OINTMENT | CUTANEOUS | Status: DC | PRN
  Administered 2017-11-05: 1 via TOPICAL
  Filled 2017-11-05: qty 7

## 2017-11-05 NOTE — Plan of Care (Signed)
  Nutrition: Adequate nutrition will be maintained 11/05/2017 [id number] - Progressing by William Daltonarpenter, Rosendo Couser L, RN   Elimination: Will not experience complications related to urinary retention 11/05/2017 [id number] - Progressing by William Daltonarpenter, Sheranda Seabrooks L, RN   Pain Managment: General experience of comfort will improve 11/05/2017 0822 - Progressing by William Daltonarpenter, Arwilda Georgia L, RN

## 2017-11-05 NOTE — Progress Notes (Signed)
PROGRESS NOTE  Samantha Cherry ZOX:[id number] DOB: 04-23-1971 DOA: 11/04/2017 PCP: Patient, No Pcp Per  HPI/Recap of past 24 hours:  Persistent severe diffuse wheezing, nonproductive cough o2 sats low 90's on room air at rest  Not a reliable historian, she reports she was hospitalized in [place] for a week for asthma exacerbation, per chart review she was hospitalized for three days.  She requests to have her thyroids meds which she quit taking a year ago, she does not have pmd, she states the thyroid meds start with "MEm", she said her thyoids is low, she can not loose weight without her thyroids meds per chart review she was prescribed synthroid back in 2017  She continue to smoke one pack a day She does not have a driver's license, her friends drives her  Assessment/Plan: Principal Problem:   Acute respiratory failure with hypoxia (HCC) Active Problems:   Asthma   Smoker   Asthma exacerbation   Asthma attack  Acute hypoxic respiratory failure from [facility] asthma exacerbation:  she reports wheezes all the time, she reports having attacks every months, she saids she run out her athma meds for several months, she does not have pmd. She does not have insurance Increase nebs and steroids , add rocephin to zithro, iv mag Remain in the hospital  aki on CKDII Cr 1.24 on presentation,  Likely prerenal Improving on ivf, cr today 1.02. Renal dosing meds  Hypothyroid: check tsh Both synthroids and methimazole listed on her home meds list, she has not been on any of this for a long time.  She is not able to provide reliable history.  smoking Nicotine path  Substance abuse uds + cocaine, + amphetamines  Body mass index is 35.2 kg/m.    Code Status: full  Family Communication: patient   Disposition Plan: not ready to discharge   Consultants:  none  Procedures:  none  Antibiotics:  Rocephin/zithro   Objective: BP 127/82 (BP Location: Left Arm)   Pulse  63   Temp 97.8 F (36.6 C) (Oral)   Resp 20   Wt 95.9 kg (211 lb 8 oz)   LMP 10/04/2017 (LMP Unknown)   SpO2 91%   BMI 35.20 kg/m   Intake/Output Summary (Last 24 hours) at 11/05/2017 0945 Last data filed at 11/05/2017 0654 Gross per 24 hour  Intake 2349.83 ml  Output 500 ml  Net 1849.83 ml   Filed Weights   11/04/17 1409  Weight: 95.9 kg (211 lb 8 oz)    Exam: Patient is examined daily including today on 11/05/2017, exams remain the same as of yesterday except that has changed    General:  NAD  Cardiovascular: RRR  Respiratory: diffuse bilateral wheezing  Abdomen: Soft/ND/NT, positive BS  Musculoskeletal: No Edema  Neuro: alert, oriented   Data Reviewed: Basic Metabolic Panel: Recent Labs  Lab 11/04/17 0744 11/05/17 0401  NA 134* 137  K 3.8 4.2  CL 102 107  CO2 24 26  GLUCOSE 127* 108*  BUN 13 9  CREATININE 1.24* 1.02*  CALCIUM 8.3* 8.9   Liver Function Tests: No results for input(s): AST, ALT, ALKPHOS, BILITOT, PROT, ALBUMIN in the last 168 hours. No results for input(s): LIPASE, AMYLASE in the last 168 hours. No results for input(s): AMMONIA in the last 168 hours. CBC: Recent Labs  Lab 11/04/17 0744 11/05/17 0401  WBC 10.7* 9.0  NEUTROABS 7.3  --   HGB 13.9 11.7*  HCT 42.4 36.7  MCV 89.6 90.0  PLT 335  319   Cardiac Enzymes:   No results for input(s): CKTOTAL, CKMB, CKMBINDEX, TROPONINI in the last 168 hours. BNP (last 3 results) No results for input(s): BNP in the last 8760 hours.  ProBNP (last 3 results) No results for input(s): PROBNP in the last 8760 hours.  CBG: No results for input(s): GLUCAP in the last 168 hours.  No results found for this or any previous visit (from the past 240 hour(s)).   Studies: No results found.  Scheduled Meds: . azithromycin  500 mg Oral Daily  . guaiFENesin  600 mg Oral BID  . ipratropium-albuterol  3 mL Nebulization Q6H  . methylPREDNISolone (SOLU-MEDROL) injection  60 mg Intravenous Q8H  .  nicotine  21 mg Transdermal Daily  . sodium chloride flush  3 mL Intravenous Q12H    Continuous Infusions: . sodium chloride 250 mL (11/04/17 1655)  . sodium chloride    . albuterol 10 mg/hr (11/04/17 1204)  . cefTRIAXone (ROCEPHIN)  IV    . magnesium sulfate 1 - 4 g bolus IVPB       Time spent: 35 mins I have personally reviewed and interpreted on  11/05/2017 daily labs, ekg, imagings as discussed above under date review session and assessment and plans.  I reviewed all nursing notes, vitals, pertinent old records  I have discussed plan of care as described above with RN , patient on 11/05/2017   Albertine Grates MD, PhD  [facility] Pager [phone]. If 7PM-7AM, please contact night-coverage at [email or web address], password [facility] 11/05/2017, 9:45 AM  LOS: 0 days

## 2017-11-06 DIAGNOSIS — J9601 Acute respiratory failure with hypoxia: Secondary | ICD-10-CM

## 2017-11-06 LAB — RESPIRATORY PANEL BY PCR
ADENOVIRUS-RVPPCR: NOT DETECTED
Bordetella pertussis: NOT DETECTED
CORONAVIRUS NL63-RVPPCR: NOT DETECTED
CORONAVIRUS OC43-RVPPCR: DETECTED — AB
Chlamydophila pneumoniae: NOT DETECTED
Coronavirus 229E: NOT DETECTED
Coronavirus HKU1: NOT DETECTED
INFLUENZA A-RVPPCR: NOT DETECTED
Influenza B: NOT DETECTED
METAPNEUMOVIRUS-RVPPCR: NOT DETECTED
Mycoplasma pneumoniae: NOT DETECTED
PARAINFLUENZA VIRUS 1-RVPPCR: NOT DETECTED
PARAINFLUENZA VIRUS 2-RVPPCR: NOT DETECTED
PARAINFLUENZA VIRUS 3-RVPPCR: NOT DETECTED
PARAINFLUENZA VIRUS 4-RVPPCR: NOT DETECTED
RHINOVIRUS / ENTEROVIRUS - RVPPCR: NOT DETECTED
Respiratory Syncytial Virus: NOT DETECTED

## 2017-11-06 LAB — COMPREHENSIVE METABOLIC PANEL
ALT: 13 U/L — ABNORMAL LOW (ref 14–54)
AST: 19 U/L (ref 15–41)
Albumin: 3.6 g/dL (ref 3.5–5.0)
Alkaline Phosphatase: 70 U/L (ref 38–126)
Anion gap: 9 (ref 5–15)
BILIRUBIN TOTAL: 0.1 mg/dL — AB (ref 0.3–1.2)
BUN: 14 mg/dL (ref 6–20)
CO2: 23 mmol/L (ref 22–32)
Calcium: 8.6 mg/dL — ABNORMAL LOW (ref 8.9–10.3)
Chloride: 105 mmol/L (ref 101–111)
Creatinine, Ser: 1.08 mg/dL — ABNORMAL HIGH (ref 0.44–1.00)
Glucose, Bld: 138 mg/dL — ABNORMAL HIGH (ref 65–99)
POTASSIUM: 3.9 mmol/L (ref 3.5–5.1)
Sodium: 137 mmol/L (ref 135–145)
TOTAL PROTEIN: 7.5 g/dL (ref 6.5–8.1)

## 2017-11-06 LAB — TSH: TSH: 34.288 u[IU]/mL — ABNORMAL HIGH (ref 0.350–4.500)

## 2017-11-06 LAB — MAGNESIUM: MAGNESIUM: 2.1 mg/dL (ref 1.7–2.4)

## 2017-11-06 MED ORDER — BENZONATATE 100 MG PO CAPS: 100.0000 mg | ORAL_CAPSULE | Freq: Three times a day (TID) | ORAL | 0 refills | Status: DC | PRN

## 2017-11-06 MED ORDER — DOXYCYCLINE HYCLATE 100 MG PO CAPS: 100.0000 mg | ORAL_CAPSULE | Freq: Two times a day (BID) | ORAL | 0 refills | Status: DC

## 2017-11-06 MED ORDER — BENZONATATE 100 MG PO CAPS: 100.0000 mg | ORAL_CAPSULE | Freq: Three times a day (TID) | ORAL | 0 refills | Status: AC | PRN

## 2017-11-06 MED ORDER — LEVOTHYROXINE SODIUM 50 MCG PO TABS: 50.0000 ug | ORAL_TABLET | Freq: Every day | ORAL | 0 refills | Status: AC

## 2017-11-06 MED ORDER — DOXYCYCLINE HYCLATE 100 MG PO CAPS: 100.0000 mg | ORAL_CAPSULE | Freq: Two times a day (BID) | ORAL | 0 refills | Status: AC

## 2017-11-06 MED ORDER — ALBUTEROL SULFATE HFA 108 (90 BASE) MCG/ACT IN AERS
2.0000 | INHALATION_SPRAY | Freq: Four times a day (QID) | RESPIRATORY_TRACT | Status: DC | PRN
  Filled 2017-11-06: qty 6.7

## 2017-11-06 MED ORDER — LEVOTHYROXINE SODIUM 50 MCG PO TABS: 50.0000 ug | ORAL_TABLET | Freq: Every day | ORAL | Status: DC

## 2017-11-06 MED ORDER — ALBUTEROL SULFATE (2.5 MG/3ML) 0.083% IN NEBU: 3.0000 mL | INHALATION_SOLUTION | Freq: Four times a day (QID) | RESPIRATORY_TRACT | Status: DC | PRN

## 2017-11-06 MED ORDER — ALBUTEROL SULFATE (2.5 MG/3ML) 0.083% IN NEBU
3.0000 mL | INHALATION_SOLUTION | Freq: Four times a day (QID) | RESPIRATORY_TRACT | Status: DC | PRN
  Administered 2017-11-06: 3 mL via RESPIRATORY_TRACT
  Filled 2017-11-06: qty 3

## 2017-11-06 MED ORDER — ALBUTEROL SULFATE HFA 108 (90 BASE) MCG/ACT IN AERS: 2.0000 | INHALATION_SPRAY | Freq: Four times a day (QID) | RESPIRATORY_TRACT | 2 refills | Status: AC | PRN

## 2017-11-06 MED ORDER — GUAIFENESIN ER 600 MG PO TB12: 600.0000 mg | ORAL_TABLET | Freq: Two times a day (BID) | ORAL | 0 refills | Status: AC

## 2017-11-06 MED ORDER — PREDNISONE 10 MG PO TABS: ORAL_TABLET | ORAL | 0 refills | Status: AC

## 2017-11-06 MED ORDER — GUAIFENESIN ER 600 MG PO TB12: 600.0000 mg | ORAL_TABLET | Freq: Two times a day (BID) | ORAL | 0 refills | Status: DC

## 2017-11-06 NOTE — Progress Notes (Signed)
Pt discharged to home with friend.  Pt verbalized understanding of discharge instructions and follow up care.  All belongings sent home with pt.  Education provided re: medications, activity, when to call MD and follow up care.  Sundra AlandMaura S Isabelly Kobler, RN

## 2017-11-06 NOTE — Discharge Summary (Signed)
Discharge Summary  Samantha Cherry ZOX:[id number] DOB: 1971/02/02  PCP: Patient, No Pcp Per  Admit date: 11/04/2017 Discharge date: 11/06/2017  Time spent: >330mins, more than 50% time spent on coordination of care.  Recommendations for Outpatient Follow-up:  1. F/u with PMD within a week  for hospital discharge follow up, repeat cbc/bmp at follow up. pmd to repeat tsh in 4-6 weeks 2. F/u with pulmonology for copd eval.  Discharge Diagnoses:  Active Hospital Problems   Diagnosis Date Noted  . Acute respiratory failure with hypoxia (HCC)   . Hypoxia 11/05/2017  . Asthma attack 11/04/2017  . Asthma   . Asthma exacerbation   . Smoker     Resolved Hospital Problems  No resolved problems to display.    Discharge Condition: stable  Diet recommendation: regular diet  Filed Weights   11/04/17 1409 11/06/17 1443  Weight: 95.9 kg (211 lb 8 oz) 100.8 kg (222 lb 4.8 oz)    History of present illness:  PCP: Patient, No Pcp Per  Patient coming from:  home  Chief Complaint:   Wheezing, sob  HPI: Samantha Cherry is a 47 y.o. female with medical history significant of asthma, tobacco abuse, hypothyroidism ran out of all her home meds weeks ago comes in with 3 days of progressive worsening sob and wheezing and nonproductive cough.  No n/v/d.  Chills but no fevers known.  She is exhausted and hasnt slept for several days.  No edema or swelling or leg pain.  Pt referred for admission for asthma exacerbation with hypoxia of unknown degree.  Pt is refusing abg in ED.     Hospital Course:  Principal Problem:   Acute respiratory failure with hypoxia (HCC) Active Problems:   Asthma   Smoker   Asthma exacerbation   Asthma attack   Hypoxia  Acute hypoxic respiratory failure from [place] asthma exacerbation: - she reports wheezes all the time, she reports having attacks every months, she saids she run out her athma meds for several months, she does not have pmd. She does not  have insurance - she had diffuse bilateral wheezing initially , she received  Nebs, iv steroids ,  iv mag, she is treated with rocephin to zithro - wheezing has much improved, she is weaned off oxygen , she ambulated on room air without hypoxia -respiratory viral panel + for coronavirus, mrsa screening positive for MRSA, cxr no acute findings, she has no fever, no leukocytosis. -She is discharged on doxycycline, steroids taper, albuterol inhalor. she is encouraged to stop smoking, she is to establish care with [facility], case manager consulted, input appreciated.  AKI on CKDII -Cr 1.24 on presentation,  Likely prerenal Improving on ivf Renal dosing meds  Hypothyroid:  Both synthroids and methimazole listed on her home meds list, she has not been on any of this for a long time.  She is not able to provide reliable history.  tsh 33, she is started on synthroid supplement.  smoking Nicotine path  Substance abuse uds + cocaine, + amphetamines  Obesity: Body mass index is 35.2 kg/m.    Code Status: full  Family Communication: patient   Disposition Plan: discharge home   Consultants:  none  Procedures:  none  Antibiotics:  Rocephin/zithro   Discharge Exam: BP (!) 170/85 (BP Location: Left Arm)   Pulse 78   Temp 98 F (36.7 C) (Oral)   Resp 20   Ht 5\' 5"  (1.651 m)   Wt  100.8 kg (222 lb 4.8 oz)   LMP 10/04/2017 (LMP Unknown)   SpO2 94%   BMI 36.99 kg/m   General: NAD Cardiovascular: RRR Respiratory: wheezing has much improved, almost resolved.  Discharge Instructions You were cared for by a hospitalist during your hospital stay. If you have any questions about your discharge medications or the care you received while you were in the hospital after you are discharged, you can call the unit and asked to speak with the hospitalist on call if the hospitalist that took care of you is not available. Once you  are discharged, your primary care physician will handle any further medical issues. Please note that NO REFILLS for any discharge medications will be authorized once you are discharged, as it is imperative that you return to your primary care physician (or establish a relationship with a primary care physician if you do not have one) for your aftercare needs so that they can reassess your need for medications and monitor your lab values.  Discharge Instructions    Diet general   Complete by:  As directed    Increase activity slowly   Complete by:  As directed      Allergies as of 11/06/2017      Reactions   Sulfa Antibiotics Anaphylaxis, Hives, Itching   Penicillins Hives   Has patient had a PCN reaction causing immediate rash, facial/tongue/throat swelling, SOB or lightheadedness with hypotension: Unknown Has patient had a PCN reaction causing severe rash involving mucus membranes or skin necrosis: Unknown Has patient had a PCN reaction that required hospitalization: Unknown Has patient had a PCN reaction occurring within the last 10 years: Unknown If all of the above answers are "NO", then may proceed with Cephalosporin use.      Medication List    STOP taking these medications   methimazole 10 MG tablet Commonly known as:  TAPAZOLE     TAKE these medications   albuterol 108 (90 Base) MCG/ACT inhaler Commonly known as:  PROVENTIL HFA;VENTOLIN HFA Inhale 2 puffs into the lungs every 6 (six) hours as needed for wheezing or shortness of breath.   benzonatate 100 MG capsule Commonly known as:  TESSALON Take 1 capsule (100 mg total) by mouth 3 (three) times daily as needed for cough.   doxycycline 100 MG capsule Commonly known as:  VIBRAMYCIN Take 1 capsule (100 mg total) by mouth 2 (two) times daily for 5 days.   guaiFENesin 600 MG 12 hr tablet Commonly known as:  MUCINEX Take 1 tablet (600 mg total) by mouth 2 (two) times daily.   levothyroxine 50 MCG tablet Commonly known  as:  SYNTHROID, LEVOTHROID Take 1 tablet (50 mcg total) by mouth daily before breakfast. Start taking on:  11/07/2017   predniSONE 10 MG tablet Commonly known as:  DELTASONE Label  & dispense according to the schedule below. 6 Pills PO on day one then, 5 Pills PO on day two, 4 Pills PO on day three, 3Pills PO on day four, 2 Pills PO on day five, 1 Pills PO on day six,  then STOP.  Total of 21 tabs      Allergies  Allergen Reactions  . Sulfa Antibiotics Anaphylaxis, Hives and Itching  . Penicillins Hives    Has patient had a PCN reaction causing immediate rash, facial/tongue/throat swelling, SOB or lightheadedness with hypotension: Unknown Has patient had a PCN reaction causing severe rash involving mucus membranes or skin necrosis: Unknown Has patient had a PCN reaction that required  hospitalization: Unknown Has patient had a PCN reaction occurring within the last 10 years: Unknown If all of the above answers are "NO", then may proceed with Cephalosporin use.    Follow-up Information    [facility] Follow up in 1 week(s).   Why:  establish primar care. pmd to repeat thyroid function in 4-6 weeks and continue adjust thyroid meds (synthroid) dose.  hospital discharge follow up. Contact information: [address] [place] [address] [phone]       [facility] Follow up in 3 week(s).   Specialty:  Pulmonology Why:  for asthma/ copd management please consider quit smoking. Contact information: [address] [place] [address] [phone]           The results of significant diagnostics from this hospitalization (including imaging, microbiology, ancillary and laboratory) are listed below for reference.    Significant Diagnostic Studies: Dg Chest 2 View  Result Date: 11/04/2017 CLINICAL DATA:  Shortness of Breath EXAM: CHEST  2 VIEW COMPARISON:  11/18/2013 FINDINGS: Heart is normal size.  No confluent airspace opacities or effusions. No acute bony abnormality. IMPRESSION: No active cardiopulmonary disease. Electronically Signed   By: Charlett Nose M.D.   On: 11/04/2017 08:04    Microbiology: Recent Results (from the past 240 hour(s))  MRSA PCR Screening     Status: Abnormal   Collection Time: 11/05/17  9:54 AM  Result Value Ref Range Status   MRSA by PCR POSITIVE (A) NEGATIVE Final    Comment:        The GeneXpert MRSA Assay (FDA approved for NASAL specimens only), is one component of a comprehensive MRSA colonization surveillance program. It is not intended to diagnose MRSA infection nor to guide or monitor treatment for MRSA infections. RESULT CALLED TO, READ BACK BY AND VERIFIED WITHEtheleen Mayhew [id number] @ 1835 BY J SCOTTON   Respiratory Panel by PCR     Status: Abnormal   Collection Time: 11/05/17  9:54 AM  Result Value Ref Range Status   Adenovirus NOT DETECTED NOT DETECTED Final   Coronavirus 229E NOT DETECTED NOT DETECTED Final   Coronavirus HKU1 NOT DETECTED NOT DETECTED Final   Coronavirus NL63 NOT DETECTED NOT DETECTED Final   Coronavirus OC43 DETECTED (A) NOT DETECTED Final   Metapneumovirus NOT DETECTED NOT DETECTED Final   Rhinovirus / Enterovirus NOT DETECTED NOT DETECTED Final   Influenza A NOT DETECTED NOT DETECTED Final   Influenza B NOT DETECTED NOT DETECTED Final   Parainfluenza Virus 1 NOT DETECTED NOT DETECTED Final   Parainfluenza Virus 2 NOT DETECTED NOT DETECTED Final   Parainfluenza Virus 3 NOT DETECTED NOT DETECTED Final   Parainfluenza Virus 4 NOT DETECTED NOT DETECTED Final   Respiratory Syncytial Virus NOT DETECTED NOT DETECTED Final   Bordetella pertussis NOT DETECTED NOT DETECTED Final   Chlamydophila pneumoniae NOT DETECTED NOT DETECTED Final   Mycoplasma pneumoniae NOT DETECTED NOT DETECTED Final    Comment: Performed at [facility], [address], [place], [place] [address]     Labs: Basic Metabolic  Panel: Recent Labs  Lab 11/04/17 0744 11/05/17 0401 11/06/17 0354  NA 134* 137 137  K 3.8 4.2 3.9  CL 102 107 105  CO2 24 26 23   GLUCOSE 127* 108* 138*  BUN 13 9 14   CREATININE 1.24* 1.02* 1.08*  CALCIUM 8.3* 8.9 8.6*  MG  --   --  2.1   Liver Function Tests:  Recent Labs  Lab 11/06/17 0354  AST 19  ALT 13*  ALKPHOS 70  BILITOT 0.1*  PROT 7.5  ALBUMIN 3.6   No results for input(s): LIPASE, AMYLASE in the last 168 hours. No results for input(s): AMMONIA in the last 168 hours. CBC: Recent Labs  Lab 11/04/17 0744 11/05/17 0401  WBC 10.7* 9.0  NEUTROABS 7.3  --   HGB 13.9 11.7*  HCT 42.4 36.7  MCV 89.6 90.0  PLT 335 319   Cardiac Enzymes: No results for input(s): CKTOTAL, CKMB, CKMBINDEX, TROPONINI in the last 168 hours. BNP: BNP (last 3 results) No results for input(s): BNP in the last 8760 hours.  ProBNP (last 3 results) No results for input(s): PROBNP in the last 8760 hours.  CBG: No results for input(s): GLUCAP in the last 168 hours.     Signed:  Albertine Grates MD, PhD  [facility] 11/06/2017, 5:22 PM

## 2017-11-06 NOTE — Progress Notes (Signed)
   11/06/17 1443  Oxygen Therapy  SpO2 93 % (while ambulating)  O2 Device Room Air  Height and Weight  Height 5\' 5"  (1.651 m)  Weight 222 lb 4.8 oz (100.8 kg)  Type of Scale Used Bed  Type of Weight Actual  BSA (Calculated - sq m) 2.15 sq meters  BMI (Calculated) 36.99  Weight in (lb) to have BMI = 25 149.9

## 2017-11-06 NOTE — Progress Notes (Signed)
Pt states she has no insurance or PCP. Pt given [facility] information and she states she will make a follow up appointment. Pt also given information for GoodRx website for medication coupons. CM will provide pt with GoodRx coupons when discharge medications are known. CM will continue to follow. Sandford Crazeora Madilyne Tadlock RN,BSN,NCM [phone]

## 2018-06-19 ENCOUNTER — Encounter: Payer: Self-pay | Admitting: Emergency Medicine

## 2018-06-19 ENCOUNTER — Emergency Department
Admission: EM | Admit: 2018-06-19 | Discharge: 2018-06-19 | Disposition: A | Discharge status: Home / Self Care | Payer: Self-pay | Attending: Emergency Medicine | Admitting: Emergency Medicine

## 2018-06-19 ENCOUNTER — Emergency Department: Payer: Self-pay

## 2018-06-19 DIAGNOSIS — J45901 Unspecified asthma with (acute) exacerbation: Secondary | ICD-10-CM | POA: Insufficient documentation

## 2018-06-19 DIAGNOSIS — F17210 Nicotine dependence, cigarettes, uncomplicated: Secondary | ICD-10-CM | POA: Insufficient documentation

## 2018-06-19 DIAGNOSIS — Z79899 Other long term (current) drug therapy: Secondary | ICD-10-CM | POA: Insufficient documentation

## 2018-06-19 MED ORDER — IPRATROPIUM-ALBUTEROL 0.5-2.5 (3) MG/3ML IN SOLN
3.0000 mL | Freq: Once | RESPIRATORY_TRACT | Status: AC
  Administered 2018-06-19: 3 mL via RESPIRATORY_TRACT
  Filled 2018-06-19: qty 3

## 2018-06-19 MED ORDER — ALBUTEROL SULFATE (2.5 MG/3ML) 0.083% IN NEBU
INHALATION_SOLUTION | RESPIRATORY_TRACT | Status: AC
  Filled 2018-06-19: qty 3

## 2018-06-19 MED ORDER — ALBUTEROL SULFATE HFA 108 (90 BASE) MCG/ACT IN AERS: 2.0000 | INHALATION_SPRAY | Freq: Four times a day (QID) | RESPIRATORY_TRACT | 0 refills | Status: AC | PRN

## 2018-06-19 MED ORDER — METHYLPREDNISOLONE SODIUM SUCC 125 MG IJ SOLR
125.0000 mg | Freq: Once | INTRAMUSCULAR | Status: AC
  Administered 2018-06-19: 125 mg via INTRAVENOUS
  Filled 2018-06-19: qty 2

## 2018-06-19 MED ORDER — ALBUTEROL SULFATE (2.5 MG/3ML) 0.083% IN NEBU
5.0000 mg | INHALATION_SOLUTION | Freq: Once | RESPIRATORY_TRACT | Status: AC
  Administered 2018-06-19: 5 mg via RESPIRATORY_TRACT
  Filled 2018-06-19: qty 6

## 2018-06-19 MED ORDER — PREDNISONE 20 MG PO TABS: 40.0000 mg | ORAL_TABLET | Freq: Every day | ORAL | 0 refills | Status: AC

## 2018-06-19 NOTE — ED Provider Notes (Signed)
[facility] Emergency Department Provider Note   ____________________________________________   I have reviewed the triage vital signs and the nursing notes.   HISTORY  Chief Complaint Shortness of Breath   History limited by: Not Limited   HPI Samantha Cherry is a 47 y.o. female who presents to the emergency department today because of concerns for shortness of breath.  Patient states her breathing is gotten worse over the past 3 days.  It has been accompanied by nonbloody cough.  Patient denies any associated chest pain.  She has not had any fevers.  She does have a history of asthma.  Patient states she no longer has an inhaler at home.  Per medical record review patient has a history of asthma and smoking.  Past Medical History:  Diagnosis Date  . Acute respiratory failure with hypoxia (HCC)   . Asthma   . Asthma exacerbation   . Smoker     Patient Active Problem List   Diagnosis Date Noted  . Hypoxia 11/05/2017  . Asthma attack 11/04/2017  . Asthma   . Smoker   . Asthma exacerbation   . Acute respiratory failure with hypoxia [facility])     Past Surgical History:  Procedure Laterality Date  . TUBAL LIGATION      Prior to Admission medications   Medication Sig Start Date End Date Taking? Authorizing Provider  albuterol (PROVENTIL HFA;VENTOLIN HFA) 108 (90 Base) MCG/ACT inhaler Inhale 2 puffs into the lungs every 6 (six) hours as needed for wheezing or shortness of breath. 11/06/17   Albertine Grates, MD  benzonatate (TESSALON) 100 MG capsule Take 1 capsule (100 mg total) by mouth 3 (three) times daily as needed for cough. 11/06/17   Albertine Grates, MD  guaiFENesin (MUCINEX) 600 MG 12 hr tablet Take 1 tablet (600 mg total) by mouth 2 (two) times daily. 11/06/17   Albertine Grates, MD  levothyroxine (SYNTHROID, LEVOTHROID) 50 MCG tablet Take 1 tablet (50 mcg total) by mouth daily before breakfast. 11/07/17   Albertine Grates, MD  predniSONE (DELTASONE) 10 MG tablet Label  &  dispense according to the schedule below. 6 Pills PO on day one then, 5 Pills PO on day two, 4 Pills PO on day three, 3Pills PO on day four, 2 Pills PO on day five, 1 Pills PO on day six,  then STOP.  Total of 21 tabs 11/06/17   Albertine Grates, MD    Allergies Sulfa antibiotics and Penicillins  No family history on file.  Social History Social History   Tobacco Use  . Smoking status: Current Every Day Smoker    Packs/day: 1.00    Types: Cigarettes  . Smokeless tobacco: Never Used  Substance Use Topics  . Alcohol use: Yes  . Drug use: Yes    Types: Cocaine    Review of Systems Constitutional: No fever/chills Eyes: No visual changes. ENT: No sore throat. Cardiovascular: Denies chest pain. Respiratory: Positive for shortness of breath cough Gastrointestinal: No abdominal pain.  No nausea, no vomiting.  No diarrhea.   Genitourinary: Negative for dysuria. Musculoskeletal: Negative for back pain. Skin: Negative for rash. Neurological: Negative for headaches, focal weakness or numbness.  ____________________________________________   PHYSICAL EXAM:  VITAL SIGNS: ED Triage Vitals  Enc Vitals Group     BP 06/19/18 1340 (!) 181/138     Pulse Rate 06/19/18 1340 89     Resp 06/19/18 1340 20     Temp 06/19/18 1340 98.2 F (36.8 C)  Temp Source 06/19/18 1340 Oral     SpO2 06/19/18 1340 96 %     Weight 06/19/18 1340 190 lb (86.2 kg)     Height 06/19/18 1340 5\' 5"  (1.651 m)     Head Circumference --      Peak Flow --      Pain Score 06/19/18 1347 8   Constitutional: Alert and oriented.  Eyes: Conjunctivae are normal.  ENT      Head: Normocephalic and atraumatic.      Nose: No congestion/rhinnorhea.      Mouth/Throat: Mucous membranes are moist.      Neck: No stridor. Hematological/Lymphatic/Immunilogical: No cervical lymphadenopathy. Cardiovascular: Normal rate, regular rhythm.  No murmurs, rubs, or gallops.  Respiratory: Normal respiratory effort without tachypnea nor  retractions.  Diffuse expiratory wheezing Gastrointestinal: Soft and non tender. No rebound. No guarding.  Genitourinary: Deferred Musculoskeletal: Normal range of motion in all extremities. No lower extremity edema. Neurologic:  Normal speech and language. No gross focal neurologic deficits are appreciated.  Skin:  Skin is warm, dry and intact. No rash noted. Psychiatric: Mood and affect are normal. Speech and behavior are normal. Patient exhibits appropriate insight and judgment.  ____________________________________________    LABS (pertinent positives/negatives)  None  ____________________________________________   EKG  I, Phineas SemenGraydon Chriselda Leppert, attending physician, personally viewed and interpreted this EKG  EKG Time: 1348 Rate: 82 Rhythm: normal sinus rhythm Axis: normal Intervals: qtc 460 QRS: narrow, q waves v1, v2 ST changes: no st elevation Impression: abnormal ekg   ____________________________________________    RADIOLOGY  CXR Chronic bronchitic changes.   ____________________________________________   PROCEDURES  Procedures  ____________________________________________   INITIAL IMPRESSION / ASSESSMENT AND PLAN / ED COURSE  Pertinent labs & imaging results that were available during my care of the patient were reviewed by me and considered in my medical decision making (see chart for details).   Patient presented to the emergency department for shortness of breath.  At this point I think likely asthma exacerbation.  I doubt pneumonia, pneumothorax, ACS.  Patient's x-ray does show some chronic changes and slight hyperinflation consistent with asthma.  Patient did feel better after breathing treatments.  Will also give steroids here and discharged with prescription for steroids and albuterol inhaler.  ____________________________________________   FINAL CLINICAL IMPRESSION(S) / ED DIAGNOSES  Final diagnoses:  Exacerbation of asthma, unspecified  asthma severity, unspecified whether persistent     Note: This dictation was prepared with Dragon dictation. Any transcriptional errors that result from this process are unintentional     Phineas SemenGoodman, Lizzete Gough, MD 06/19/18 [phone]

## 2018-06-19 NOTE — Discharge Instructions (Signed)
Please seek medical attention for any high fevers, chest pain, shortness of breath, change in behavior, persistent vomiting, bloody stool or any other new or concerning symptoms.  

## 2018-06-19 NOTE — ED Triage Notes (Signed)
Pt arrives with family with concerns over shortness of breath. Wheezing heard on auscultation and pt very short of breath in triage, finding it hard to answer questions. 97% oxygen saturation on room air

## 2019-07-07 IMAGING — CR DG CHEST 2V
1 series · 1 of 1 positions shown · non-contrast
Comparison: 11/18/2013

CLINICAL DATA: Shortness of Breath

EXAM:
CHEST  2 VIEW

[w chest lat]
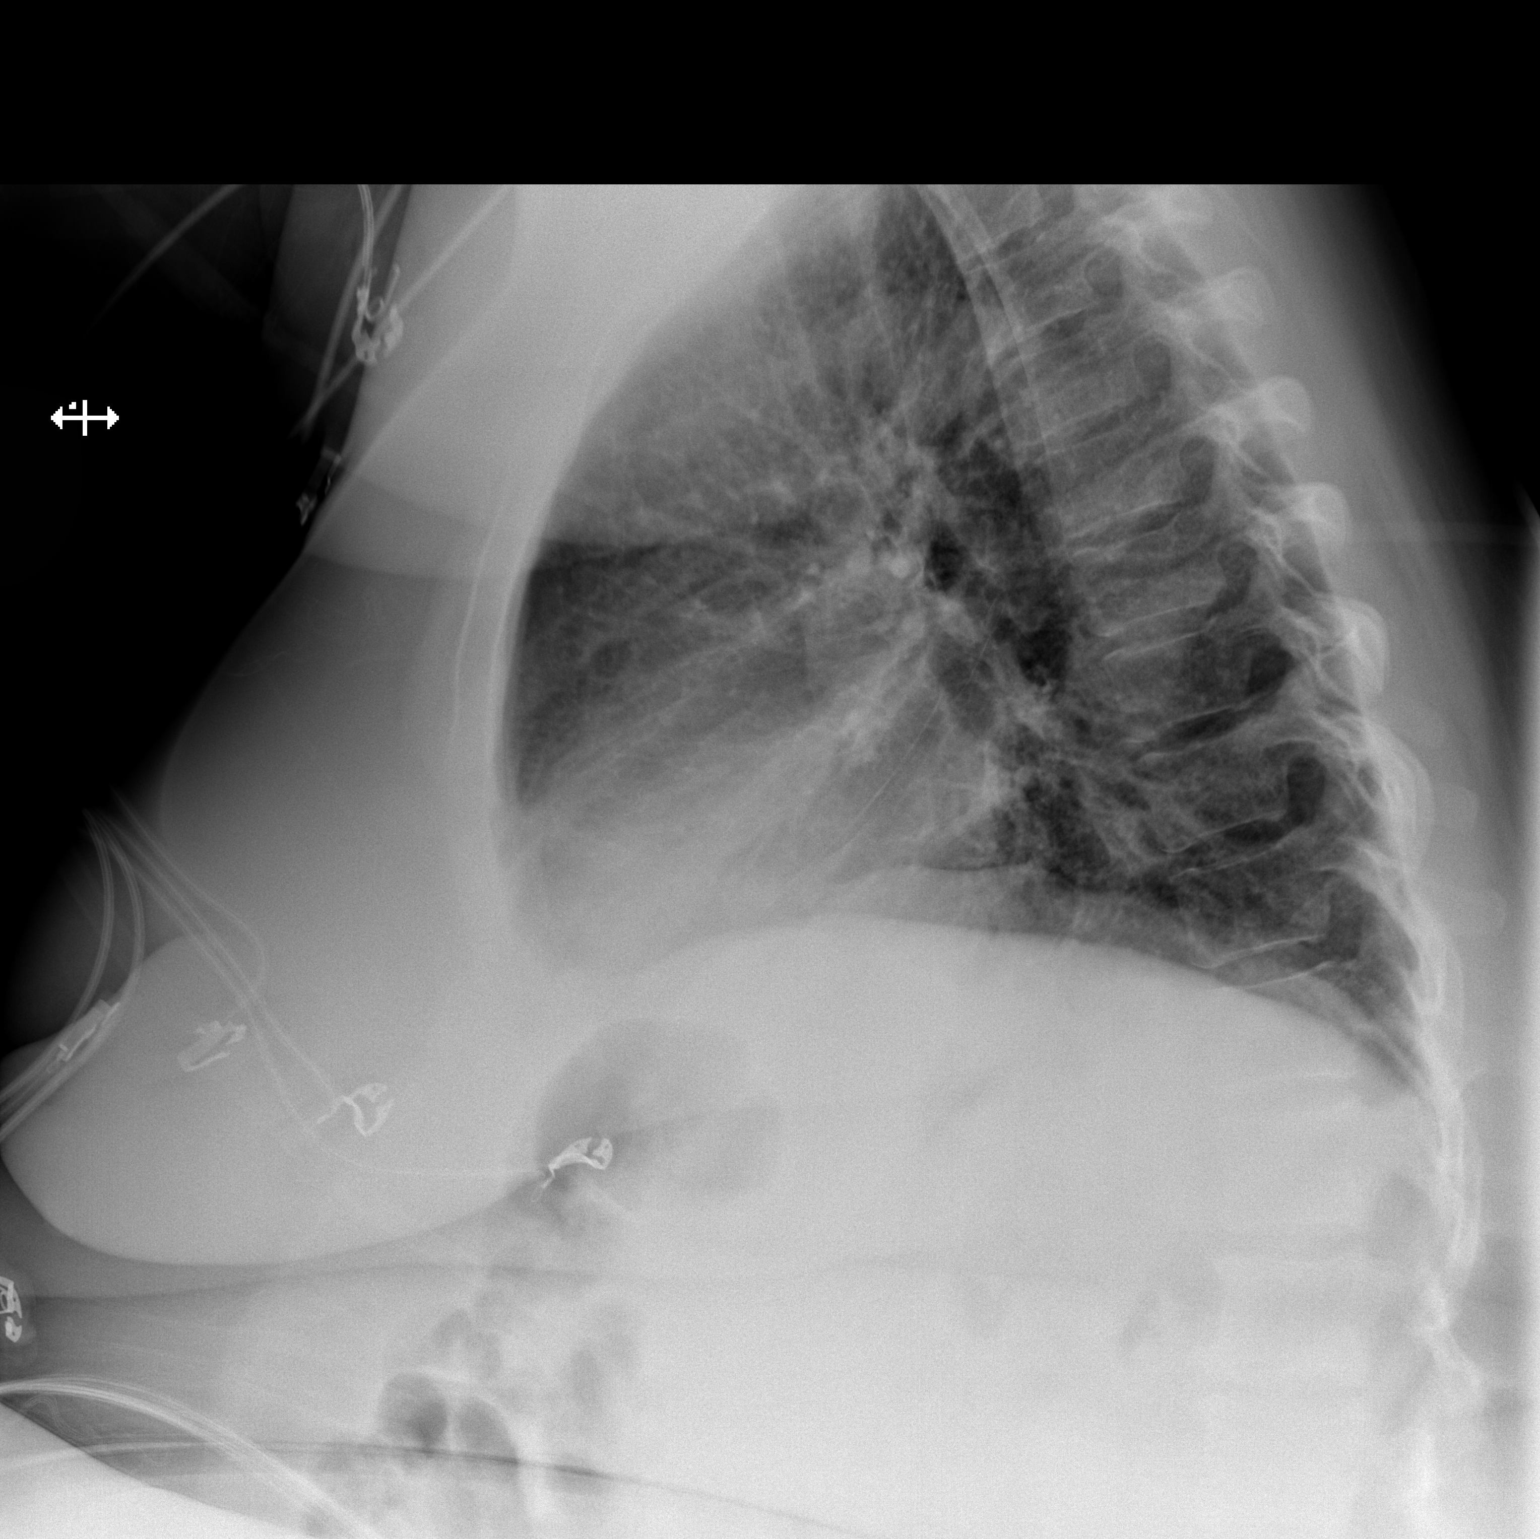

[1 of 1 positions shown; findings below may reference images not displayed]

FINDINGS: Heart is normal size. No confluent airspace opacities or effusions.
No acute bony abnormality.
IMPRESSION: No active cardiopulmonary disease.
